# Patient Record
Sex: Male | Born: 1940 | Race: White | Hispanic: No | Marital: Single | Smoking: Never smoker
Health system: Southern US, Community
[De-identification: ages and names within clinical notes are randomized; demographics above are authoritative.]

## PROBLEM LIST (undated history)

## (undated) DIAGNOSIS — F79 Unspecified intellectual disabilities: Secondary | ICD-10-CM

## (undated) DIAGNOSIS — R569 Unspecified convulsions: Secondary | ICD-10-CM

## (undated) DIAGNOSIS — F39 Unspecified mood [affective] disorder: Secondary | ICD-10-CM

## (undated) DIAGNOSIS — K219 Gastro-esophageal reflux disease without esophagitis: Secondary | ICD-10-CM

## (undated) DIAGNOSIS — F419 Anxiety disorder, unspecified: Secondary | ICD-10-CM

## (undated) DIAGNOSIS — F0390 Unspecified dementia without behavioral disturbance: Secondary | ICD-10-CM

## (undated) HISTORY — PX: OTHER SURGICAL HISTORY: SHX169

---

## 1998-01-23 ENCOUNTER — Other Ambulatory Visit: Admission: RE | Admit: 1998-01-23 | Discharge: 1998-01-23 | Payer: Self-pay | Admitting: Family Medicine

## 1998-05-09 ENCOUNTER — Other Ambulatory Visit: Admission: RE | Admit: 1998-05-09 | Discharge: 1998-05-09 | Payer: Self-pay | Admitting: Family Medicine

## 1998-10-25 ENCOUNTER — Encounter: Payer: Self-pay | Admitting: Emergency Medicine

## 1998-10-25 ENCOUNTER — Emergency Department (HOSPITAL_COMMUNITY): Admission: EM | Admit: 1998-10-25 | Discharge: 1998-10-25 | Payer: Self-pay | Admitting: Emergency Medicine

## 2009-08-31 ENCOUNTER — Emergency Department (HOSPITAL_COMMUNITY): Admission: EM | Admit: 2009-08-31 | Discharge: 2009-08-31 | Payer: Self-pay | Admitting: Emergency Medicine

## 2010-09-30 ENCOUNTER — Emergency Department (HOSPITAL_COMMUNITY)
Admission: EM | Admit: 2010-09-30 | Discharge: 2010-09-30 | Payer: Self-pay | Source: Home / Self Care | Admitting: Emergency Medicine

## 2010-12-29 LAB — URINE CULTURE
Colony Count: NO GROWTH
Culture  Setup Time: 201112141920
Culture: NO GROWTH

## 2010-12-29 LAB — POCT URINALYSIS DIPSTICK
Bilirubin Urine: NEGATIVE
Glucose, UA: NEGATIVE mg/dL
Ketones, ur: NEGATIVE mg/dL
Nitrite: NEGATIVE
Protein, ur: NEGATIVE mg/dL
Specific Gravity, Urine: 1.01 (ref 1.005–1.030)
Urobilinogen, UA: 0.2 mg/dL (ref 0.0–1.0)
pH: 6 (ref 5.0–8.0)

## 2011-01-20 LAB — DIFFERENTIAL
Basophils Relative: 1 % (ref 0–1)
Lymphs Abs: 1.4 10*3/uL (ref 0.7–4.0)
Monocytes Relative: 5 % (ref 3–12)
Neutro Abs: 3.1 10*3/uL (ref 1.7–7.7)
Neutrophils Relative %: 60 % (ref 43–77)

## 2011-01-20 LAB — URINALYSIS, ROUTINE W REFLEX MICROSCOPIC
Hgb urine dipstick: NEGATIVE
Nitrite: NEGATIVE
Specific Gravity, Urine: 1.01 (ref 1.005–1.030)
Urobilinogen, UA: 1 mg/dL (ref 0.0–1.0)

## 2011-01-20 LAB — CBC
HCT: 37.1 % — ABNORMAL LOW (ref 39.0–52.0)
Hemoglobin: 12.4 g/dL — ABNORMAL LOW (ref 13.0–17.0)
MCHC: 33.3 g/dL (ref 30.0–36.0)
MCV: 100.6 fL — ABNORMAL HIGH (ref 78.0–100.0)
RDW: 12.6 % (ref 11.5–15.5)

## 2011-01-20 LAB — COMPREHENSIVE METABOLIC PANEL
BUN: 15 mg/dL (ref 6–23)
Calcium: 8.9 mg/dL (ref 8.4–10.5)
GFR calc non Af Amer: >60 mL/min (ref >60)
Glucose, Bld: 106 mg/dL — ABNORMAL HIGH (ref 70–99)
Total Protein: 6.2 g/dL (ref 6.0–8.3)

## 2011-01-20 LAB — URINE CULTURE

## 2011-01-20 LAB — LITHIUM LEVEL: Lithium Lvl: 0.87 mEq/L (ref 0.80–1.40)

## 2011-05-11 ENCOUNTER — Ambulatory Visit: Payer: PRIVATE HEALTH INSURANCE | Attending: Family Medicine | Admitting: Audiology

## 2011-05-11 DIAGNOSIS — H6120 Impacted cerumen, unspecified ear: Secondary | ICD-10-CM | POA: Insufficient documentation

## 2011-05-11 DIAGNOSIS — H918X9 Other specified hearing loss, unspecified ear: Secondary | ICD-10-CM | POA: Insufficient documentation

## 2012-03-20 DIAGNOSIS — R569 Unspecified convulsions: Secondary | ICD-10-CM | POA: Insufficient documentation

## 2012-04-26 ENCOUNTER — Emergency Department (HOSPITAL_COMMUNITY): Payer: PRIVATE HEALTH INSURANCE

## 2012-04-26 ENCOUNTER — Emergency Department (HOSPITAL_COMMUNITY)
Admission: EM | Admit: 2012-04-26 | Discharge: 2012-04-26 | Disposition: A | Discharge status: Home / Self Care | Payer: PRIVATE HEALTH INSURANCE | Attending: Emergency Medicine | Admitting: Emergency Medicine

## 2012-04-26 ENCOUNTER — Encounter (HOSPITAL_COMMUNITY): Payer: Self-pay

## 2012-04-26 DIAGNOSIS — R4182 Altered mental status, unspecified: Secondary | ICD-10-CM

## 2012-04-26 DIAGNOSIS — J9819 Other pulmonary collapse: Secondary | ICD-10-CM | POA: Insufficient documentation

## 2012-04-26 DIAGNOSIS — J3489 Other specified disorders of nose and nasal sinuses: Secondary | ICD-10-CM | POA: Insufficient documentation

## 2012-04-26 LAB — CBC WITH DIFFERENTIAL/PLATELET
Basophils Absolute: 0 10*3/uL (ref 0.0–0.1)
Basophils Relative: 1 % (ref 0–1)
Eosinophils Absolute: 0.5 10*3/uL (ref 0.0–0.7)
Eosinophils Relative: 6 % — ABNORMAL HIGH (ref 0–5)
HCT: 38.4 % — ABNORMAL LOW (ref 39.0–52.0)
Hemoglobin: 12.8 g/dL — ABNORMAL LOW (ref 13.0–17.0)
Lymphocytes Relative: 25 % (ref 12–46)
Lymphs Abs: 2.1 10*3/uL (ref 0.7–4.0)
MCH: 31.9 pg (ref 26.0–34.0)
MCHC: 33.3 g/dL (ref 30.0–36.0)
MCV: 95.8 fL (ref 78.0–100.0)
Monocytes Absolute: 0.7 10*3/uL (ref 0.1–1.0)
Monocytes Relative: 8 % (ref 3–12)
Neutro Abs: 5.2 10*3/uL (ref 1.7–7.7)
Neutrophils Relative %: 61 % (ref 43–77)
Platelets: 138 10*3/uL — ABNORMAL LOW (ref 150–400)
RBC: 4.01 MIL/uL — ABNORMAL LOW (ref 4.22–5.81)
RDW: 14.2 % (ref 11.5–15.5)
WBC: 8.5 10*3/uL (ref 4.0–10.5)

## 2012-04-26 LAB — URINALYSIS, ROUTINE W REFLEX MICROSCOPIC
Bilirubin Urine: NEGATIVE
Glucose, UA: NEGATIVE mg/dL
Ketones, ur: NEGATIVE mg/dL
Protein, ur: NEGATIVE mg/dL

## 2012-04-26 LAB — COMPREHENSIVE METABOLIC PANEL
ALT: 14 U/L (ref 0–53)
AST: 19 U/L (ref 0–37)
Albumin: 3.9 g/dL (ref 3.5–5.2)
Alkaline Phosphatase: 101 U/L (ref 39–117)
BUN: 24 mg/dL — ABNORMAL HIGH (ref 6–23)
CO2: 27 mEq/L (ref 19–32)
Calcium: 9.4 mg/dL (ref 8.4–10.5)
Chloride: 104 mEq/L (ref 96–112)
Creatinine, Ser: 1.27 mg/dL (ref 0.50–1.35)
GFR calc Af Amer: 64 mL/min — ABNORMAL LOW (ref >90)
GFR calc non Af Amer: 55 mL/min — ABNORMAL LOW (ref >90)
Glucose, Bld: 110 mg/dL — ABNORMAL HIGH (ref 70–99)
Potassium: 4.4 mEq/L (ref 3.5–5.1)
Sodium: 140 mEq/L (ref 135–145)
Total Bilirubin: 0.2 mg/dL — ABNORMAL LOW (ref 0.3–1.2)
Total Protein: 7.2 g/dL (ref 6.0–8.3)

## 2012-04-26 LAB — LITHIUM LEVEL: Lithium Lvl: 0.98 mEq/L (ref 0.80–1.40)

## 2012-04-26 NOTE — ED Notes (Signed)
[facility] pt is not as alert and gait disturbance noted, pt is alert and talking but still not at baseline.

## 2012-04-26 NOTE — ED Notes (Signed)
Patient transported to CT 

## 2012-04-27 NOTE — ED Provider Notes (Signed)
History     CSN: [id number]  Arrival date & time 04/26/12  1328   First MD Initiated Contact with Patient 04/26/12 1954      Chief Complaint  Patient presents with  . Altered Mental Status    (Consider location/radiation/quality/duration/timing/severity/associated sxs/prior treatment) HPI Comments: Patient is the resident of an ecf.  Was brought here for eval of not being quite himself.  He is less verbal and is more unsteady of his feet.  There is no pain and the patient is otherwise without complaint.  He has a history of MR and is accompanied by caregivers.  They are the ones giving the majority of the history as they say he does not express himself very well.  A Level 5 Caveat therefore applies.    Patient is a 71 y.o. male presenting with altered mental status. The history is provided by the patient.  Altered Mental Status This is a new problem. Episode onset: this afternoon. The problem occurs constantly. The problem has been gradually improving. Pertinent negatives include no chest pain, no headaches and no shortness of breath. Nothing aggravates the symptoms. Nothing relieves the symptoms. He has tried nothing for the symptoms.    History reviewed. No pertinent past medical history.  History reviewed. No pertinent past surgical history.  History reviewed. No pertinent family history.  History  Substance Use Topics  . Smoking status: Not on file  . Smokeless tobacco: Not on file  . Alcohol Use: Not on file      Review of Systems  Respiratory: Negative for shortness of breath.   Cardiovascular: Negative for chest pain.  Neurological: Negative for headaches.  Psychiatric/Behavioral: Positive for altered mental status.  All other systems reviewed and are negative.    Allergies  Review of patient's allergies indicates no known allergies.  Home Medications   Current Outpatient Rx  Name Route Sig Dispense Refill  . ACETAMINOPHEN 325 MG PO TABS Oral Take 650 mg  by mouth every 6 (six) hours as needed. For pain    . BENZTROPINE MESYLATE 0.5 MG PO TABS Oral Take 0.5 mg by mouth 2 (two) times daily.    Marland Kitchen FAMOTIDINE 20 MG PO TABS Oral Take 20 mg by mouth daily.    Marland Kitchen KETOCONAZOLE 2 % EX CREA Topical Apply 1 application topically 2 (two) times daily as needed. For ring worm rash    . LEVETIRACETAM 250 MG PO TABS Oral Take 250 mg by mouth every 12 (twelve) hours.    Marland Kitchen LITHIUM CARBONATE 300 MG PO CAPS Oral Take 300 mg by mouth 2 (two) times daily with a meal.    . LORAZEPAM 0.5 MG PO TABS Oral Take 0.25 mg by mouth every morning.    Marland Kitchen MEMANTINE HCL 10 MG PO TABS Oral Take 10 mg by mouth 2 (two) times daily.    . OXYBUTYNIN CHLORIDE ER 10 MG PO TB24 Oral Take 10 mg by mouth daily.    Marland Kitchen RISPERIDONE 0.5 MG PO TABS Oral Take 0.5 mg by mouth daily.    Marland Kitchen RISPERIDONE 2 MG PO TABS Oral Take 2 mg by mouth at bedtime.      BP 125/77  Pulse 62  Temp 97.4 F (36.3 C) (Oral)  Resp 16  SpO2 95%  Physical Exam  Nursing note and vitals reviewed. Constitutional: He is oriented to person, place, and time. He appears well-developed and well-nourished. No distress.  HENT:  Head: Normocephalic and atraumatic.  Mouth/Throat: Oropharynx is clear and moist.  Eyes: EOM are normal. Pupils are equal, round, and reactive to light.  Neck: Normal range of motion. Neck supple.  Cardiovascular: Normal rate and regular rhythm.   No murmur heard. Pulmonary/Chest: Effort normal and breath sounds normal. No respiratory distress. He has no wheezes.  Abdominal: Soft. Bowel sounds are normal. He exhibits no distension. There is no tenderness.  Musculoskeletal: Normal range of motion.  Lymphadenopathy:    He has no cervical adenopathy.  Neurological: He is alert and oriented to person, place, and time. No cranial nerve deficit. He exhibits normal muscle tone. Coordination normal.  Skin: Skin is warm and dry. He is not diaphoretic.    ED Course  Procedures (including critical care  time)  Labs Reviewed  CBC WITH DIFFERENTIAL - Abnormal; Notable for the following:    RBC 4.01 (*)     Hemoglobin 12.8 (*)     HCT 38.4 (*)     Platelets 138 (*)     Eosinophils Relative 6 (*)     All other components within normal limits  COMPREHENSIVE METABOLIC PANEL - Abnormal; Notable for the following:    Glucose, Bld 110 (*)     BUN 24 (*)     Total Bilirubin 0.2 (*)     GFR calc non Af Amer 55 (*)     GFR calc Af Amer 64 (*)     All other components within normal limits  URINALYSIS, ROUTINE W REFLEX MICROSCOPIC - Abnormal; Notable for the following:    Hgb urine dipstick TRACE (*)     All other components within normal limits  LITHIUM LEVEL  URINE MICROSCOPIC-ADD ON  LAB REPORT - SCANNED   Dg Chest 2 View  04/26/2012  *RADIOLOGY REPORT*  Clinical Data: Altered mental status  CHEST - 2 VIEW  Comparison: None.  Findings: Hypoventilation with decreased lung volume and mild atelectasis in the lung bases.  Negative for heart failure or pneumonia.  No pleural effusion.  The aorta is uncoiled,  IMPRESSION: Mild bibasilar atelectasis.  Original Report Authenticated By: Camelia Phenes, M.D.   Ct Head Wo Contrast  04/26/2012  *RADIOLOGY REPORT*  Clinical Data: Altered mental status.  CT HEAD WITHOUT CONTRAST  Technique:  Contiguous axial images were obtained from the base of the skull through the vertex without contrast.  Comparison: None.  Findings: There is no acute intracranial hemorrhage, infarction, or mass lesion.  Brain parenchyma is normal.  No acute osseous abnormality.  Chronic extensive opacification of the ethmoid and frontal sinuses.  Chronic mucosal thickening of the maxillary sinuses.  IMPRESSION: No acute intracranial abnormality.  Chronic sinus disease.  Original Report Authenticated By: Gwynn Burly, M.D.     1. Mental status change       MDM  The patient presents for eval of not acting quite himself.  I initiated a workup that included laboratory work, ua,  and ct of the head.  These were all unremarkable.  His mental status improved and seemed to return to his baseline.  At this point, he will be discharged.  To return prn if he worsens.        Geoffery Lyons, MD 04/27/12 1009

## 2012-10-04 ENCOUNTER — Other Ambulatory Visit: Payer: Self-pay | Admitting: Nephrology

## 2012-10-04 ENCOUNTER — Ambulatory Visit
Admission: RE | Admit: 2012-10-04 | Discharge: 2012-10-04 | Disposition: A | Discharge status: Home / Self Care | Payer: PRIVATE HEALTH INSURANCE | Source: Ambulatory Visit | Attending: Nephrology | Admitting: Nephrology

## 2012-10-04 DIAGNOSIS — M25559 Pain in unspecified hip: Secondary | ICD-10-CM

## 2013-01-24 ENCOUNTER — Encounter (HOSPITAL_COMMUNITY): Payer: Self-pay | Admitting: Emergency Medicine

## 2013-01-24 ENCOUNTER — Emergency Department (HOSPITAL_COMMUNITY): Payer: PRIVATE HEALTH INSURANCE

## 2013-01-24 ENCOUNTER — Emergency Department (HOSPITAL_COMMUNITY)
Admission: EM | Admit: 2013-01-24 | Discharge: 2013-01-24 | Disposition: A | Discharge status: Home / Self Care | Payer: PRIVATE HEALTH INSURANCE | Attending: Emergency Medicine | Admitting: Emergency Medicine

## 2013-01-24 DIAGNOSIS — R29898 Other symptoms and signs involving the musculoskeletal system: Secondary | ICD-10-CM

## 2013-01-24 DIAGNOSIS — Z8669 Personal history of other diseases of the nervous system and sense organs: Secondary | ICD-10-CM | POA: Insufficient documentation

## 2013-01-24 DIAGNOSIS — Z8659 Personal history of other mental and behavioral disorders: Secondary | ICD-10-CM | POA: Insufficient documentation

## 2013-01-24 DIAGNOSIS — Y9389 Activity, other specified: Secondary | ICD-10-CM | POA: Insufficient documentation

## 2013-01-24 DIAGNOSIS — M6281 Muscle weakness (generalized): Secondary | ICD-10-CM | POA: Insufficient documentation

## 2013-01-24 DIAGNOSIS — R51 Headache: Secondary | ICD-10-CM | POA: Insufficient documentation

## 2013-01-24 DIAGNOSIS — Y9210 Unspecified residential institution as the place of occurrence of the external cause: Secondary | ICD-10-CM | POA: Insufficient documentation

## 2013-01-24 DIAGNOSIS — W1811XA Fall from or off toilet without subsequent striking against object, initial encounter: Secondary | ICD-10-CM | POA: Insufficient documentation

## 2013-01-24 DIAGNOSIS — W19XXXA Unspecified fall, initial encounter: Secondary | ICD-10-CM

## 2013-01-24 HISTORY — DX: Unspecified mood (affective) disorder: F39

## 2013-01-24 HISTORY — DX: Unspecified convulsions: R56.9

## 2013-01-24 HISTORY — DX: Unspecified intellectual disabilities: F79

## 2013-01-24 LAB — POCT I-STAT, CHEM 8
BUN: 21 mg/dL (ref 6–23)
Calcium, Ion: 1.24 mmol/L (ref 1.13–1.30)
Chloride: 112 mEq/L (ref 96–112)
HCT: 41 % (ref 39.0–52.0)
Potassium: 3.9 mEq/L (ref 3.5–5.1)

## 2013-01-24 LAB — CBC
HCT: 40.2 % (ref 39.0–52.0)
MCV: 96.6 fL (ref 78.0–100.0)
Platelets: 147 10*3/uL — ABNORMAL LOW (ref 150–400)
RBC: 4.16 MIL/uL — ABNORMAL LOW (ref 4.22–5.81)
WBC: 11 10*3/uL — ABNORMAL HIGH (ref 4.0–10.5)

## 2013-01-24 LAB — POCT I-STAT TROPONIN I: Troponin i, poc: 0.08 ng/mL (ref 0.00–0.08)

## 2013-01-24 NOTE — Progress Notes (Signed)
CSW met with pt and pt house manager of group home, [facility], at bedside. Pt states he is feeling well but hungry. Per [name], pt is usually independent with minimal assistance. Per discussion with [name] patient can return when medically stable. CSW continuing to follow to assist with pt further disposition needs.   Catha Gosselin, Theresia Majors  [phone] 01/24/2013 12:44pm

## 2013-01-24 NOTE — ED Notes (Signed)
Pt presenting to ed with c/o sent from group home [facility] for fall this morning pt and staff deny loc pt is alert at this time. Pt able to answer certain questions appropriately. Per staff states that pt was a witnessed fall.

## 2013-01-24 NOTE — ED Provider Notes (Signed)
History     CSN: [id number]  Arrival date & time 01/24/13  1028   First MD Initiated Contact with Patient 01/24/13 1043      Chief Complaint  Patient presents with  . Fall    (Consider location/radiation/quality/duration/timing/severity/associated sxs/prior treatment) HPI Shawn Wilcox is a 72 y.o. male who presents to ED from a group home, with complaint of a fall. Pt was in the bathroom by himself, when he states he slid and fell off the commode. When staff went in there immediately after him falling, they stated he was on the floor against the door, and had stool all over the commode and floor. Pt was alert, at his baseline mental status wise. They tried to get him up but were unable to. Pt seemed to have weakness in both legs. Pt normally walks by himself, since the fall, difficulty standing and walking. Here for evaluation. Pt denies any pain. No signs of head trauma. Pt has hx of MR, poor historian.    Past Medical History  Diagnosis Date  . Mental retardation   . Seizures   . Mood disorder     History reviewed. No pertinent past surgical history.  No family history on file.  History  Substance Use Topics  . Smoking status: Never Smoker   . Smokeless tobacco: Not on file  . Alcohol Use: No      Review of Systems  Constitutional: Negative for fever and chills.  Skin: Negative for wound.  Neurological: Positive for weakness. Negative for headaches.  All other systems reviewed and are negative.    Allergies  Review of patient's allergies indicates no known allergies.  Home Medications  No current outpatient prescriptions on file.  There were no vitals taken for this visit.  Physical Exam  Nursing note and vitals reviewed. Constitutional: He appears well-developed and well-nourished. No distress.  HENT:  Head: Normocephalic and atraumatic.  Nose: Nose normal.  Mouth/Throat: Oropharynx is clear and moist.  Eyes: Conjunctivae and EOM are normal. Pupils are  equal, round, and reactive to light.  Neck: Neck supple.  Cardiovascular: Normal rate, regular rhythm and normal heart sounds.   Pulmonary/Chest: Effort normal and breath sounds normal. No respiratory distress. He has no wheezes. He has no rales.  Abdominal: Soft. Bowel sounds are normal. He exhibits no distension. There is no tenderness. There is no rebound.  Musculoskeletal: Normal range of motion.  No tenderness over entire spine. No tenderness over pelvis. Full rom of bilateral hips, knees, ankles.   Neurological: He is alert. No cranial nerve deficit. Coordination normal.  5/5 and equal upper and lower extremity strength. Pt follows all commands appropriatly  Skin: Skin is warm and dry.    ED Course  Procedures (including critical care time)   Date: 01/24/2013  Rate: 77  Rhythm: normal sinus rhythm  QRS Axis: normal  Intervals: normal  ST/T Wave abnormalities: normal  Conduction Disutrbances:first-degree A-V block   Narrative Interpretation:   Old EKG Reviewed: unchanged  Results for orders placed during the hospital encounter of 01/24/13  CBC      Result Value Range   WBC 11.0 (*) 4.0 - 10.5 K/uL   RBC 4.16 (*) 4.22 - 5.81 MIL/uL   Hemoglobin 13.3  13.0 - 17.0 g/dL   HCT 40.9  81.1 - 91.4 %   MCV 96.6  78.0 - 100.0 fL   MCH 32.0  26.0 - 34.0 pg   MCHC 33.1  30.0 - 36.0 g/dL   RDW 13.3  11.5 - 15.5 %   Platelets 147 (*) 150 - 400 K/uL  POCT I-STAT, CHEM 8      Result Value Range   Sodium 147 (*) 135 - 145 mEq/L   Potassium 3.9  3.5 - 5.1 mEq/L   Chloride 112  96 - 112 mEq/L   BUN 21  6 - 23 mg/dL   Creatinine, Ser 1.61  0.50 - 1.35 mg/dL   Glucose, Bld 90  70 - 99 mg/dL   Calcium, Ion 0.96  0.45 - 1.30 mmol/L   TCO2 24  0 - 100 mmol/L   Hemoglobin 13.9  13.0 - 17.0 g/dL   HCT 40.9  81.1 - 91.4 %  POCT I-STAT TROPONIN I      Result Value Range   Troponin i, poc 0.08  0.00 - 0.08 ng/mL   Comment NOTIFIED PHYSICIAN     Comment 3            Ct Head Wo  Contrast  01/24/2013  *RADIOLOGY REPORT*  Clinical Data:  History of trauma from a fall.  Head and neck pain.  CT HEAD WITHOUT CONTRAST CT CERVICAL SPINE WITHOUT CONTRAST  Technique:  Multidetector CT imaging of the head and cervical spine was performed following the standard protocol without intravenous contrast.  Multiplanar CT image reconstructions of the cervical spine were also generated.  Comparison:  Head CT 08/31/2009.  CT HEAD  Findings: No acute displaced skull fractures are identified.  No acute intracranial abnormality.  Specifically, no evidence of acute post-traumatic intracranial hemorrhage, no definite regions of acute/subacute cerebral ischemia, no focal mass, mass effect, hydrocephalus or abnormal intra or extra-axial fluid collections. Extensive mucoperiosteal thickening throughout all aspects of the visualized paranasal sinuses, many which are completely occluded, compatible with chronic sinusitis.  Mastoids are well pneumatized.  IMPRESSION: 1.  No acute displaced skull fractures or acute intracranial abnormalities. 2.  The appearance of the brain is normal. 3.  Severe changes of chronic sinusitis, significantly worsened compared to prior examination from 08/31/2009, as above.  CT CERVICAL SPINE  Findings: No acute displaced cervical spine fractures are noted. Mild exaggeration of normal cervical lordosis.  Prevertebral soft tissues are normal.  Multilevel degenerative disc disease, most severe at C6-C7 where there is complete bony fusion.  Mild multilevel facet arthropathy.  Small ossifications posterior to the cervical spine at the level of C5-C6 likely related to prior trauma and ligamentous injury in this region.  Visualized portions of the upper thorax are unremarkable.  IMPRESSION: 1.  No evidence of significant acute traumatic injury to the cervical spine. 2.  Multilevel degenerative disc disease and cervical spondylosis, as above.   Original Report Authenticated By: Trudie Reed, M.D.     Ct Cervical Spine Wo Contrast  01/24/2013  *RADIOLOGY REPORT*  Clinical Data:  History of trauma from a fall.  Head and neck pain.  CT HEAD WITHOUT CONTRAST CT CERVICAL SPINE WITHOUT CONTRAST  Technique:  Multidetector CT imaging of the head and cervical spine was performed following the standard protocol without intravenous contrast.  Multiplanar CT image reconstructions of the cervical spine were also generated.  Comparison:  Head CT 08/31/2009.  CT HEAD  Findings: No acute displaced skull fractures are identified.  No acute intracranial abnormality.  Specifically, no evidence of acute post-traumatic intracranial hemorrhage, no definite regions of acute/subacute cerebral ischemia, no focal mass, mass effect, hydrocephalus or abnormal intra or extra-axial fluid collections. Extensive mucoperiosteal thickening throughout all aspects of the visualized paranasal sinuses, many which  are completely occluded, compatible with chronic sinusitis.  Mastoids are well pneumatized.  IMPRESSION: 1.  No acute displaced skull fractures or acute intracranial abnormalities. 2.  The appearance of the brain is normal. 3.  Severe changes of chronic sinusitis, significantly worsened compared to prior examination from 08/31/2009, as above.  CT CERVICAL SPINE  Findings: No acute displaced cervical spine fractures are noted. Mild exaggeration of normal cervical lordosis.  Prevertebral soft tissues are normal.  Multilevel degenerative disc disease, most severe at C6-C7 where there is complete bony fusion.  Mild multilevel facet arthropathy.  Small ossifications posterior to the cervical spine at the level of C5-C6 likely related to prior trauma and ligamentous injury in this region.  Visualized portions of the upper thorax are unremarkable.  IMPRESSION: 1.  No evidence of significant acute traumatic injury to the cervical spine. 2.  Multilevel degenerative disc disease and cervical spondylosis, as above.   Original Report Authenticated  By: Trudie Reed, M.D.       1. Fall, initial encounter   2. Leg weakness       MDM  Pt post fall in the bathroom today. No signs of seizure or syncope. Pt was alert when staff came in. Pt had difficulty getting up and walking, however is ambulatory here in ED. Walked up and down hallway.  Labs and CTs here unremarkable. Do not think any injury to legs or pelvis, full rom of all joints and able to bear weight with no pain. Able to ambulate with no pain. He is at baseline mental status wise. He has no neuro deficits. Doubt CVA. Will d/c home with close follow up. Assist with walking at home. Return if symptoms worsening.   Filed Vitals:   01/24/13 1321  BP: 157/83  Pulse: 81  Resp: 16  SpO2: 99%           Lottie Mussel, PA-C 01/24/13 1921

## 2013-01-24 NOTE — ED Notes (Signed)
[id number]<[id number]> Expected date:<BR> Expected time:<BR> Means of arrival:<BR> Comments:<BR> 72 y/o M fall

## 2013-01-24 NOTE — Progress Notes (Signed)
Pt confirms pcp is elizabeth barnes [facility] updated

## 2013-01-24 NOTE — ED Notes (Signed)
Gave  I Stat Troponin results to Tatyana critical 0.08

## 2013-01-25 NOTE — ED Provider Notes (Signed)
  Medical screening examination/treatment/procedure(s) were performed by non-physician practitioner and as supervising physician I was immediately available for consultation/collaboration.    Gerhard Munch, MD 01/25/13 1549

## 2013-01-29 ENCOUNTER — Encounter (HOSPITAL_COMMUNITY): Payer: Self-pay | Admitting: Emergency Medicine

## 2013-03-14 ENCOUNTER — Emergency Department (HOSPITAL_COMMUNITY): Payer: PRIVATE HEALTH INSURANCE

## 2013-03-14 ENCOUNTER — Emergency Department (HOSPITAL_COMMUNITY)
Admission: EM | Admit: 2013-03-14 | Discharge: 2013-03-14 | Disposition: A | Discharge status: Home / Self Care | Payer: PRIVATE HEALTH INSURANCE | Attending: Emergency Medicine | Admitting: Emergency Medicine

## 2013-03-14 ENCOUNTER — Encounter (HOSPITAL_COMMUNITY): Payer: Self-pay | Admitting: *Deleted

## 2013-03-14 DIAGNOSIS — F39 Unspecified mood [affective] disorder: Secondary | ICD-10-CM | POA: Insufficient documentation

## 2013-03-14 DIAGNOSIS — G40909 Epilepsy, unspecified, not intractable, without status epilepticus: Secondary | ICD-10-CM | POA: Insufficient documentation

## 2013-03-14 DIAGNOSIS — R279 Unspecified lack of coordination: Secondary | ICD-10-CM | POA: Insufficient documentation

## 2013-03-14 DIAGNOSIS — F79 Unspecified intellectual disabilities: Secondary | ICD-10-CM | POA: Insufficient documentation

## 2013-03-14 DIAGNOSIS — Z79899 Other long term (current) drug therapy: Secondary | ICD-10-CM | POA: Insufficient documentation

## 2013-03-14 DIAGNOSIS — R410 Disorientation, unspecified: Secondary | ICD-10-CM

## 2013-03-14 DIAGNOSIS — F29 Unspecified psychosis not due to a substance or known physiological condition: Secondary | ICD-10-CM | POA: Insufficient documentation

## 2013-03-14 LAB — COMPREHENSIVE METABOLIC PANEL
Alkaline Phosphatase: 105 U/L (ref 39–117)
BUN: 21 mg/dL (ref 6–23)
Chloride: 107 mEq/L (ref 96–112)
GFR calc Af Amer: 66 mL/min — ABNORMAL LOW (ref >90)
GFR calc non Af Amer: 57 mL/min — ABNORMAL LOW (ref >90)
Glucose, Bld: 90 mg/dL (ref 70–99)
Potassium: 3.9 mEq/L (ref 3.5–5.1)
Total Bilirubin: 0.5 mg/dL (ref 0.3–1.2)
Total Protein: 6.5 g/dL (ref 6.0–8.3)

## 2013-03-14 LAB — CBC WITH DIFFERENTIAL/PLATELET
Eosinophils Absolute: 0.3 10*3/uL (ref 0.0–0.7)
Hemoglobin: 12 g/dL — ABNORMAL LOW (ref 13.0–17.0)
Lymphs Abs: 1.4 10*3/uL (ref 0.7–4.0)
MCH: 32.5 pg (ref 26.0–34.0)
Monocytes Relative: 8 % (ref 3–12)
Neutrophils Relative %: 72 % (ref 43–77)
RBC: 3.69 MIL/uL — ABNORMAL LOW (ref 4.22–5.81)

## 2013-03-14 LAB — URINALYSIS, ROUTINE W REFLEX MICROSCOPIC
Bilirubin Urine: NEGATIVE
Hgb urine dipstick: NEGATIVE
Protein, ur: NEGATIVE mg/dL
Urobilinogen, UA: 0.2 mg/dL (ref 0.0–1.0)

## 2013-03-14 LAB — PROTIME-INR: Prothrombin Time: 13.2 seconds (ref 11.6–15.2)

## 2013-03-14 MED ORDER — SODIUM CHLORIDE 0.9 % IV SOLN
1000.0000 mL | INTRAVENOUS | Status: DC
  Administered 2013-03-14: 1000 mL via INTRAVENOUS

## 2013-03-14 NOTE — ED Notes (Signed)
Patient ambulated 60 feet with no assistance.

## 2013-03-14 NOTE — ED Notes (Addendum)
Per EMS pt coming from PCP office with c/o ALOC. Per EMS pt has hx of mental retardation and lives at group home where he has caregiver. Caregiver took pt to PCP today d/t changes in his mental status. Per EMS pt's caregiver reported pt was disoriented at times more than his baseline and had unsteady gait.

## 2013-03-14 NOTE — ED Provider Notes (Signed)
History    CSN: [id number] Arrival date & time 03/14/13  1248First MD Initiated Contact with Patient 03/14/13 1257      Chief Complaint  Patient presents with  . Altered Mental Status    HPI The patient presents to the emergency room because of altered mental status this morning.  The patient has history of mental retardation. He lives in a group home.  According to his caregiver today he seemed to be more unsteady than usual when he was walking. He be a little bit more disoriented than usual.  She has had trouble with his balance and his coordination in the past but usually is able to walk without difficulty. He has not had any recent falls. He has not been complaining of any pain. He is not having any fever. He went to his doctor's office and was sent to the emergency room for further evaluation.  His caregiver states that he seems to be more like his normal self at this point. He just wants to have something to eat and keeps on asking about that. Past Medical History  Diagnosis Date  . Mental retardation   . Seizures   . Mood disorder     History reviewed. No pertinent past surgical history.  History reviewed. No pertinent family history.  History  Substance Use Topics  . Smoking status: Never Smoker   . Smokeless tobacco: Not on file  . Alcohol Use: No      Review of Systems  All other systems reviewed and are negative.    Allergies  Review of patient's allergies indicates no known allergies.  Home Medications   Current Outpatient Rx  Name  Route  Sig  Dispense  Refill  . acetaminophen (TYLENOL) 325 MG tablet   Oral   Take 650 mg by mouth every 6 (six) hours as needed. For pain         . benztropine (COGENTIN) 0.5 MG tablet   Oral   Take 0.5 mg by mouth 2 (two) times daily.         . famotidine (PEPCID) 20 MG tablet   Oral   Take 20 mg by mouth daily.         Marland Kitchen ketoconazole (NIZORAL) 2 % cream   Topical   Apply 1 application topically 2 (two) times  daily as needed. For ring worm rash         . levETIRAcetam (KEPPRA) 250 MG tablet   Oral   Take 250 mg by mouth every 12 (twelve) hours.         Marland Kitchen lithium carbonate 300 MG capsule   Oral   Take 300 mg by mouth 2 (two) times daily with a meal.         . LORazepam (ATIVAN) 0.5 MG tablet   Oral   Take 0.5 mg by mouth every morning.          . memantine (NAMENDA) 10 MG tablet   Oral   Take 10 mg by mouth 2 (two) times daily.         Marland Kitchen oxybutynin (DITROPAN-XL) 10 MG 24 hr tablet   Oral   Take 10 mg by mouth daily.         . risperiDONE (RISPERDAL) 0.5 MG tablet   Oral   Take 0.5 mg by mouth daily.         . risperiDONE (RISPERDAL) 2 MG tablet   Oral   Take 2 mg by mouth at bedtime.  BP 114/71  Pulse 73  Temp(Src) 98.3 F (36.8 C)  Resp 16  SpO2 96%  Physical Exam  Nursing note and vitals reviewed. Constitutional: He appears well-developed and well-nourished. No distress.  HENT:  Head: Normocephalic and atraumatic.  Right Ear: External ear normal.  Left Ear: External ear normal.  Mouth/Throat: Oropharynx is clear and moist.  Eyes: Conjunctivae are normal. Right eye exhibits no discharge. Left eye exhibits no discharge. No scleral icterus.  Neck: Neck supple. No tracheal deviation present.  Cardiovascular: Normal rate, regular rhythm and intact distal pulses.   Pulmonary/Chest: Effort normal and breath sounds normal. No stridor. No respiratory distress. He has no wheezes. He has no rales.  Abdominal: Soft. Bowel sounds are normal. He exhibits no distension. There is no tenderness. There is no rebound and no guarding.  Musculoskeletal: He exhibits no edema and no tenderness.  Neurological: He is alert. He has normal strength. No cranial nerve deficit ( no gross defecits noted) or sensory deficit. He exhibits normal muscle tone. He displays no seizure activity. Coordination normal.  No pronator drift bilateral upper extrem, able to hold both legs  off bed for 5 seconds, sensation intact in all extremities, no visual field cuts, no left or right sided neglect, patient is aware of his location, he is able to tell me the weather outside  Skin: Skin is warm and dry. No rash noted.  Psychiatric: He has a normal mood and affect.    ED Course  Procedures (including critical care time) EKG Normal sinus rhythm first degree AV block Normal axis, normal intervals Normal ST T waves No significant change Labs Reviewed  CBC WITH DIFFERENTIAL - Abnormal; Notable for the following:    RBC 3.69 (*)    Hemoglobin 12.0 (*)    HCT 35.2 (*)    All other components within normal limits  COMPREHENSIVE METABOLIC PANEL - Abnormal; Notable for the following:    Albumin 3.4 (*)    GFR calc non Af Amer 57 (*)    GFR calc Af Amer 66 (*)    All other components within normal limits  APTT  PROTIME-INR  URINALYSIS, ROUTINE W REFLEX MICROSCOPIC   Dg Chest 1 View  03/14/2013   *RADIOLOGY REPORT*  Clinical Data: Altered mental status  CHEST - 1 VIEW  Comparison: 08/31/2009  Findings: Enlargement of cardiac silhouette. Tortuous aorta. Prominence of mediastinum likely related to AP technique. Mild right basilar atelectasis. No gross infiltrate, pleural effusion or pneumothorax.  IMPRESSION: Enlargement of cardiac silhouette. Mild right base atelectasis.   Original Report Authenticated By: Ulyses Southward, M.D.   Ct Head Wo Contrast  03/14/2013   *RADIOLOGY REPORT*  Clinical Data: Altered mental status, disorientation, unsteady gait, history mental retardation, seizures  CT HEAD WITHOUT CONTRAST  Technique:  Contiguous axial images were obtained from the base of the skull through the vertex without contrast.  Comparison: 01/24/2013  Findings: Minimal age-related atrophy. Normal ventricular morphology. No midline shift or mass effect. Otherwise normal appearance of brain parenchyma. No intracranial hemorrhage, mass lesion, or evidence of acute infarction. No extra-axial  fluid collections. Chronic mucosal thickening throughout the paranasal sinuses with air-fluid level left sphenoid sinus. No acute osseous findings.  IMPRESSION: No acute intracranial abnormalities. Chronic sinus disease changes.   Original Report Authenticated By: Ulyses Southward, M.D.     1. Confusion       MDM  Patient has been able to ambulate around the emergency department. He is alert and in no distress. Patient does  not appear to be weak or confused.  At this time there does not appear to be any evidence of an acute emergency medical condition and the patient appears stable for discharge with appropriate outpatient follow up.         Celene Kras, MD 03/14/13 [id number]

## 2013-03-14 NOTE — ED Notes (Signed)
ZOX:[id number]<AV> Expected date:<BR> Expected time:<BR> Means of arrival:<BR> Comments:<BR> Aloc, from PCP office

## 2013-04-17 ENCOUNTER — Ambulatory Visit (INDEPENDENT_AMBULATORY_CARE_PROVIDER_SITE_OTHER): Payer: Medicare Other | Admitting: Nurse Practitioner

## 2013-04-17 ENCOUNTER — Encounter: Payer: Self-pay | Admitting: Nurse Practitioner

## 2013-04-17 VITALS — BP 125/76 | HR 60 | Ht 69.0 in | Wt 226.0 lb

## 2013-04-17 DIAGNOSIS — R569 Unspecified convulsions: Secondary | ICD-10-CM

## 2013-04-17 DIAGNOSIS — G40909 Epilepsy, unspecified, not intractable, without status epilepticus: Secondary | ICD-10-CM

## 2013-04-17 DIAGNOSIS — G40309 Generalized idiopathic epilepsy and epileptic syndromes, not intractable, without status epilepticus: Secondary | ICD-10-CM | POA: Insufficient documentation

## 2013-04-17 NOTE — Patient Instructions (Addendum)
Continue Keppra at current dose Revisit in one year.

## 2013-04-17 NOTE — Progress Notes (Signed)
HPI: Patient returns for a yearly followup. He is a 72 year old male accompanied by staff from his group home for epilepsy. He was last seen 03/20/2012.   He was born with mental retardation, had epilepsy since age 51, forceful head deviation to left, followed by [facility],  he has multiple recurrent episodes for one year, but since he was on dilantin 100mg ,he no longer has seizure.  He has been on Dilantin 100 bid for 50 yrs, his last seizure was 50years ago.  During a  visit he was switched  to keppra  250 mg b.i.d.in 2011.   He has no recurrent episodes. His psych care is in [place] now. There have been no issues with behavior, no falls. He goes to a day program. No new neurologic complaints.   ROS:  Cognitive impairment  Physical Exam General: well developed, well nourished, seated, in no evident distress Head: head normocephalic and atraumatic. Oropharynx benign Neck: supple with no carotid  bruits Cardiovascular: regular rate and rhythm, no murmurs  Neurologic Exam Mental Status: Awake and fully alert. Oriented to place and time. Follows all commands. Speech and language normal.   Cranial Nerves:  Pupils equal, briskly reactive to light. Extraocular movements full without nystagmus. Visual fields full to confrontation. Hearing intact and symmetric to finger snap. Facial sensation intact. Face, tongue, palate move normally and symmetrically. Neck flexion and extension normal.  Motor: Normal bulk and tone. Normal strength in all tested extremity muscles.No focal weakness Coordination: Rapid alternating movements normal in all extremities. Finger-to-nose and heel-to-shin performed accurately bilaterally. No dysmetria Gait and Station: Arises from chair without difficulty. 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

## 2013-07-14 IMAGING — CR DG HIP (WITH OR WITHOUT PELVIS) 2-3V*L*
2 series · 2 of 2 positions shown · non-contrast
Comparison: None.

CLINICAL DATA: Severe left hip pain.

LEFT HIP - COMPLETE 2+ VIEW

[t hip ap left]
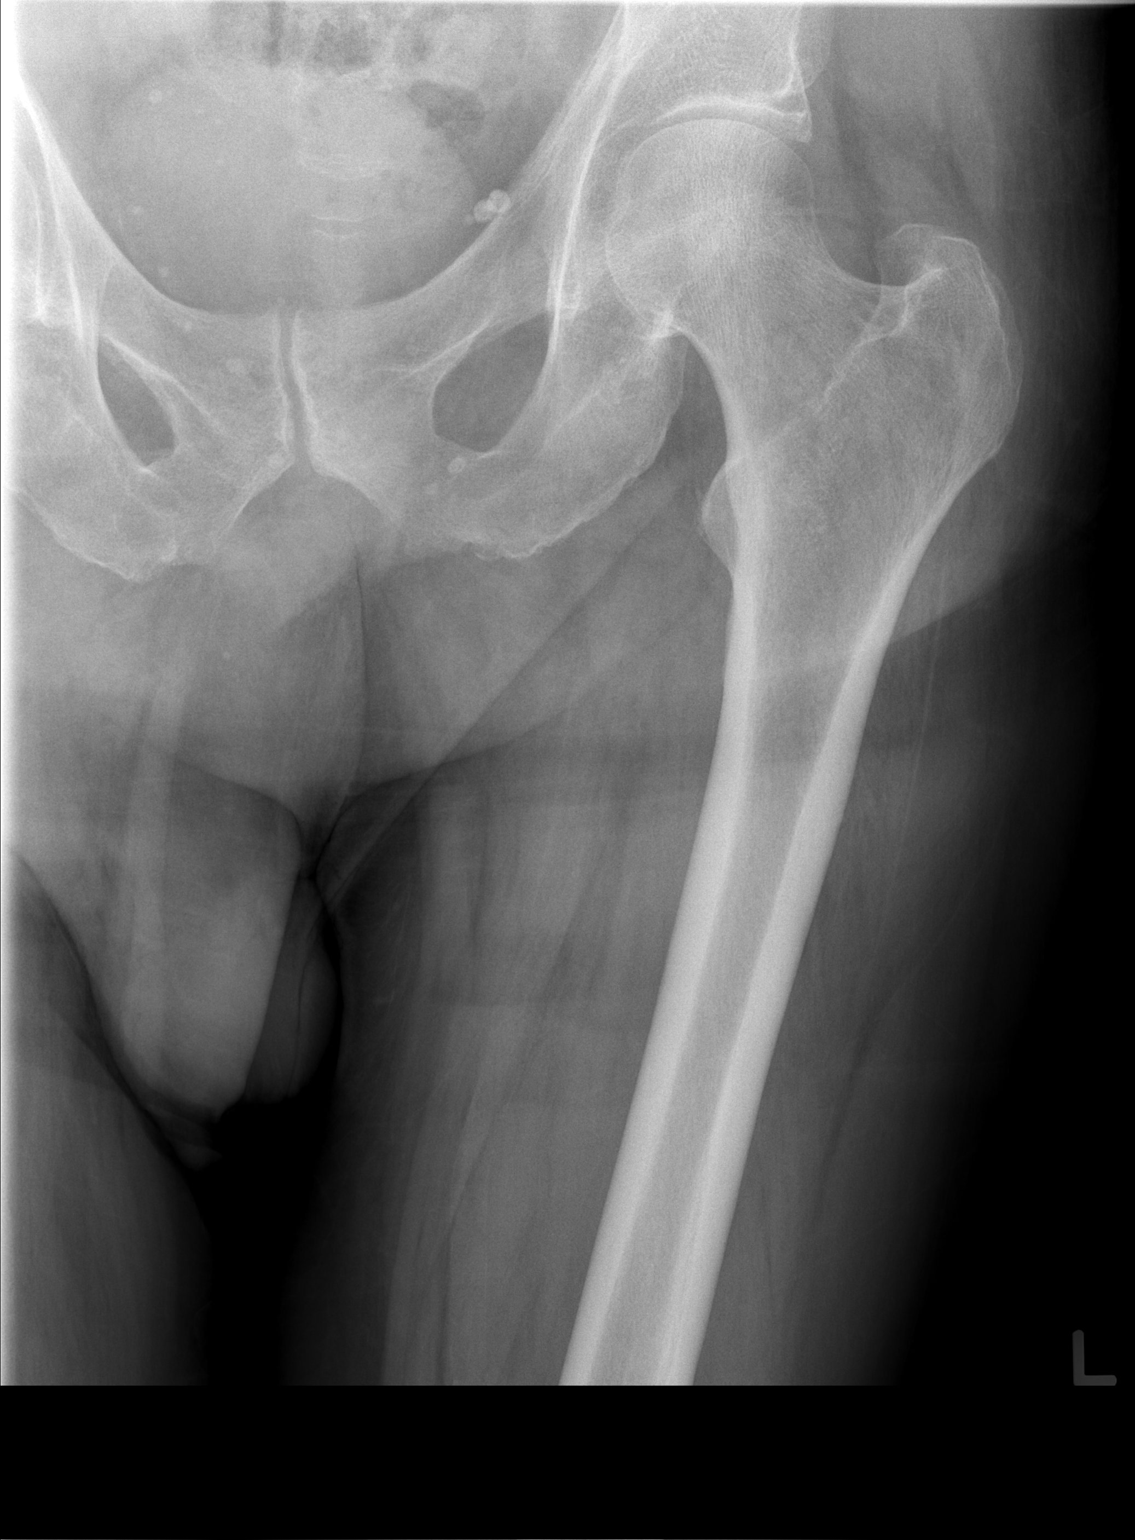

[t hip frog leg left]
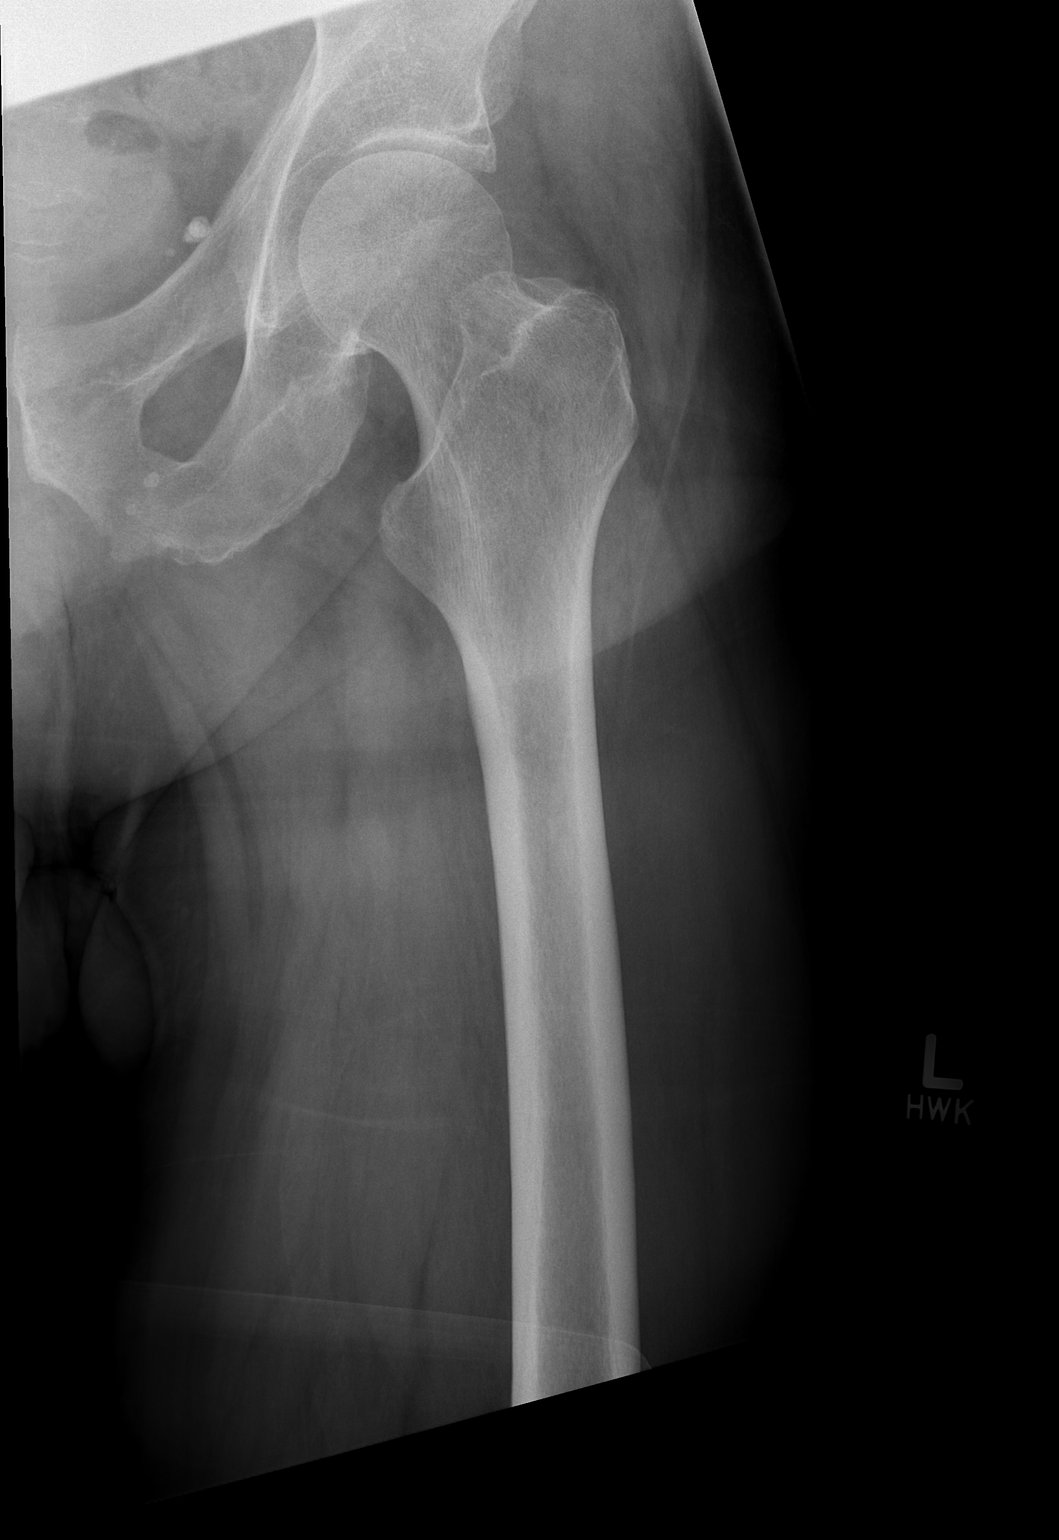

[2 of 2 positions shown; findings below may reference images not displayed]

FINDINGS: The patient was unable to bend his knee for a proper
lateral view.  Mild osteophytosis in the left hip.  Hip joint space
appears preserved.  Phleboliths in the anatomic pelvis.
IMPRESSION: Minimal degenerative change in the left hip.

## 2014-04-10 ENCOUNTER — Emergency Department (HOSPITAL_COMMUNITY): Payer: PRIVATE HEALTH INSURANCE

## 2014-04-10 ENCOUNTER — Emergency Department (HOSPITAL_COMMUNITY)
Admission: EM | Admit: 2014-04-10 | Discharge: 2014-04-10 | Disposition: A | Discharge status: Home / Self Care | Payer: PRIVATE HEALTH INSURANCE | Attending: Emergency Medicine | Admitting: Emergency Medicine

## 2014-04-10 ENCOUNTER — Encounter (HOSPITAL_COMMUNITY): Payer: Self-pay | Admitting: Emergency Medicine

## 2014-04-10 DIAGNOSIS — R5383 Other fatigue: Secondary | ICD-10-CM

## 2014-04-10 DIAGNOSIS — Y9389 Activity, other specified: Secondary | ICD-10-CM | POA: Insufficient documentation

## 2014-04-10 DIAGNOSIS — R569 Unspecified convulsions: Secondary | ICD-10-CM | POA: Insufficient documentation

## 2014-04-10 DIAGNOSIS — Z8659 Personal history of other mental and behavioral disorders: Secondary | ICD-10-CM | POA: Insufficient documentation

## 2014-04-10 DIAGNOSIS — Z043 Encounter for examination and observation following other accident: Secondary | ICD-10-CM | POA: Insufficient documentation

## 2014-04-10 DIAGNOSIS — W19XXXA Unspecified fall, initial encounter: Secondary | ICD-10-CM

## 2014-04-10 DIAGNOSIS — Z79899 Other long term (current) drug therapy: Secondary | ICD-10-CM | POA: Insufficient documentation

## 2014-04-10 DIAGNOSIS — R296 Repeated falls: Secondary | ICD-10-CM | POA: Insufficient documentation

## 2014-04-10 DIAGNOSIS — R5381 Other malaise: Secondary | ICD-10-CM | POA: Insufficient documentation

## 2014-04-10 DIAGNOSIS — Y9289 Other specified places as the place of occurrence of the external cause: Secondary | ICD-10-CM | POA: Insufficient documentation

## 2014-04-10 LAB — I-STAT CG4 LACTIC ACID, ED: Lactic Acid, Venous: 0.81 mmol/L (ref 0.5–2.2)

## 2014-04-10 LAB — URINALYSIS, ROUTINE W REFLEX MICROSCOPIC
Bilirubin Urine: NEGATIVE
Glucose, UA: NEGATIVE mg/dL
KETONES UR: NEGATIVE mg/dL
LEUKOCYTES UA: NEGATIVE
NITRITE: NEGATIVE
PH: 6 (ref 5.0–8.0)
PROTEIN: NEGATIVE mg/dL
Specific Gravity, Urine: 1.011 (ref 1.005–1.030)
Urobilinogen, UA: 0.2 mg/dL (ref 0.0–1.0)

## 2014-04-10 LAB — BASIC METABOLIC PANEL
BUN: 28 mg/dL — ABNORMAL HIGH (ref 6–23)
CHLORIDE: 107 meq/L (ref 96–112)
CO2: 21 mEq/L (ref 19–32)
Calcium: 9 mg/dL (ref 8.4–10.5)
Creatinine, Ser: 1.61 mg/dL — ABNORMAL HIGH (ref 0.50–1.35)
GFR calc Af Amer: 47 mL/min — ABNORMAL LOW (ref >90)
GFR, EST NON AFRICAN AMERICAN: 41 mL/min — AB (ref >90)
GLUCOSE: 155 mg/dL — AB (ref 70–99)
POTASSIUM: 4.1 meq/L (ref 3.7–5.3)
SODIUM: 141 meq/L (ref 137–147)

## 2014-04-10 LAB — I-STAT TROPONIN, ED: Troponin i, poc: 0.01 ng/mL (ref 0.00–0.08)

## 2014-04-10 LAB — CBC
HEMATOCRIT: 35.2 % — AB (ref 39.0–52.0)
HEMOGLOBIN: 11.3 g/dL — AB (ref 13.0–17.0)
MCH: 31.7 pg (ref 26.0–34.0)
MCHC: 32.1 g/dL (ref 30.0–36.0)
MCV: 98.9 fL (ref 78.0–100.0)
Platelets: 144 10*3/uL — ABNORMAL LOW (ref 150–400)
RBC: 3.56 MIL/uL — AB (ref 4.22–5.81)
RDW: 14.1 % (ref 11.5–15.5)
WBC: 8.8 10*3/uL (ref 4.0–10.5)

## 2014-04-10 LAB — CBG MONITORING, ED: GLUCOSE-CAPILLARY: 120 mg/dL — AB (ref 70–99)

## 2014-04-10 LAB — URINE MICROSCOPIC-ADD ON

## 2014-04-10 LAB — LITHIUM LEVEL: Lithium Lvl: 1.05 mEq/L (ref 0.80–1.40)

## 2014-04-10 MED ORDER — SODIUM CHLORIDE 0.9 % IV BOLUS (SEPSIS)
1000.0000 mL | Freq: Once | INTRAVENOUS | Status: AC
  Administered 2014-04-10: 1000 mL via INTRAVENOUS

## 2014-04-10 NOTE — ED Notes (Signed)
Family requested to talk with Thayer Ohmhris, [place] . Was concerned that pt did not have x-rays done of his hip.  [facility]. He is in talking with the pt.'s family   Also spoke with Tyler Aasoris the CSW about PT care for the pt. At the group home

## 2014-04-10 NOTE — ED Notes (Signed)
Lactic acid results given to C. Lawyer, PA-C 

## 2014-04-10 NOTE — Discharge Instructions (Signed)
Return here as needed.  Followup with your primary care Dr. home physical therapy will be needed

## 2014-04-10 NOTE — ED Notes (Signed)
Spoke with Lab to request Lithium level to blood sent down. Lab stated they can add Lithium to the blood sent down to them.

## 2014-04-10 NOTE — ED Provider Notes (Signed)
CSN: [id number]     Arrival date & time 04/10/14  4098 History   First MD Initiated Contact with Patient 04/10/14 0848     Chief Complaint  Patient presents with  . Weakness     (Consider location/radiation/quality/duration/timing/severity/associated sxs/prior Treatment) Patient is a 73 y.o. male presenting with weakness.  Weakness Associated symptoms include weakness.   Patient presents to the emergency department following a fall that occurred yesterday.  The patient has had recent falls over the last several months.  Patient lives in a group home and states, that this weakness may be increasing over the last several weeks.  The patient, states, that he doesn't know why he is falling.  Patient denies denies having any pain, but cannot give me any specific due to his MR and dementia nothing seems to make the patient's condition, better or worse.  The patient was scheduled to have home physical therapy, but the company could not see him due to his patient load . Past Medical History  Diagnosis Date  . Mental retardation   . Seizures   . Mood disorder    Past Surgical History  Procedure Laterality Date  . None     History reviewed. No pertinent family history. History  Substance Use Topics  . Smoking status: Never Smoker   . Smokeless tobacco: Not on file  . Alcohol Use: No    Review of Systems  Neurological: Positive for weakness.    Level V caveat applies due to  MR and dementia  Allergies  Review of patient's allergies indicates no known allergies.  Home Medications   Prior to Admission medications   Medication Sig Start Date End Date Taking? Authorizing Provider  acetaminophen (TYLENOL) 325 MG tablet Take 650 mg by mouth every 6 (six) hours as needed. For pain   Yes Historical Provider, MD  benztropine (COGENTIN) 0.5 MG tablet Take 0.5 mg by mouth 2 (two) times daily.   Yes Historical Provider, MD  famotidine (PEPCID) 20 MG tablet Take 20 mg by mouth daily.   Yes  Historical Provider, MD  ketoconazole (NIZORAL) 2 % cream Apply 1 application topically 2 (two) times daily as needed. For ring worm rash   Yes Historical Provider, MD  levETIRAcetam (KEPPRA) 250 MG tablet Take 250 mg by mouth every 12 (twelve) hours.   Yes Historical Provider, MD  lithium carbonate 300 MG capsule Take 300 mg by mouth 2 (two) times daily with a meal.   Yes Historical Provider, MD  LORazepam (ATIVAN) 0.5 MG tablet Take 0.5 mg by mouth every morning.    Yes Historical Provider, MD  memantine (NAMENDA) 10 MG tablet Take 10 mg by mouth 2 (two) times daily.   Yes Historical Provider, MD  oxybutynin (DITROPAN-XL) 10 MG 24 hr tablet Take 10 mg by mouth daily.   Yes Historical Provider, MD  risperiDONE (RISPERDAL) 0.5 MG tablet Take 0.5 mg by mouth daily.   Yes Historical Provider, MD  risperiDONE (RISPERDAL) 2 MG tablet Take 2 mg by mouth at bedtime.   Yes Historical Provider, MD   BP 125/72  Pulse 62  Temp(Src) 97.7 F (36.5 C) (Rectal)  Resp 16  Ht 6\' 1"  (1.854 m)  Wt 220 lb (99.791 kg)  BMI 29.03 kg/m2  SpO2 95% Physical Exam  Nursing note and vitals reviewed. Constitutional: He appears well-developed and well-nourished. No distress.  HENT:  Head: Normocephalic and atraumatic.  Eyes: Pupils are equal, round, and reactive to light.  Neck: Normal range of motion.  Neck supple.  Cardiovascular: Normal rate, regular rhythm and normal heart sounds.  Exam reveals no gallop and no friction rub.   No murmur heard. Pulmonary/Chest: Effort normal and breath sounds normal. No respiratory distress.  Abdominal: Soft. Bowel sounds are normal. He exhibits no distension. There is no tenderness.  Neurological: He is alert. He exhibits normal muscle tone. Coordination normal.  Skin: Skin is warm and dry. No erythema.    ED Course  Procedures (including critical care time) Labs Review Labs Reviewed  CBC - Abnormal; Notable for the following:    RBC 3.56 (*)    Hemoglobin 11.3 (*)     HCT 35.2 (*)    Platelets 144 (*)    All other components within normal limits  BASIC METABOLIC PANEL - Abnormal; Notable for the following:    Glucose, Bld 155 (*)    BUN 28 (*)    Creatinine, Ser 1.61 (*)    GFR calc non Af Amer 41 (*)    GFR calc Af Amer 47 (*)    All other components within normal limits  URINALYSIS, ROUTINE W REFLEX MICROSCOPIC - Abnormal; Notable for the following:    Hgb urine dipstick TRACE (*)    All other components within normal limits  CBG MONITORING, ED - Abnormal; Notable for the following:    Glucose-Capillary 120 (*)    All other components within normal limits  URINE CULTURE  URINE MICROSCOPIC-ADD ON  Rosezena SensorI-STAT TROPOININ, ED  I-STAT CG4 LACTIC ACID, ED    Imaging Review Dg Pelvis 1-2 Views  04/10/2014   CLINICAL DATA:  fall and pain  EXAM: PELVIS - 1-2 VIEW  COMPARISON:  None.  FINDINGS: There is no evidence of pelvic fracture or diastasis. Joint space narrowing and mild periarticular sclerosis within the hips. Phleboliths within the pelvis.  IMPRESSION: Mild osteoarthritic changes.  No acute osseous abnormalities.   Electronically Signed   By: Salome HolmesHector  Cooper M.D.   On: 04/10/2014 15:00   Ct Head Wo Contrast  04/10/2014   CLINICAL DATA:  Fall  EXAM: CT HEAD WITHOUT CONTRAST  TECHNIQUE: Contiguous axial images were obtained from the base of the skull through the vertex without intravenous contrast.  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 No acute infarct. Negative for hemorrhage or mass. Ventricle size is normal.  Well-circumscribed lytic lesion in the left occipital bone is stable and appears benign, probable arachnoid granulation.  Extensive mucosal thickening in the paranasal sinuses as seen previously.  IMPRESSION: No acute abnormality.   Electronically Signed   By: Marlan Palauharles  Clark M.D.   On: 04/10/2014 12:22   Dg Chest Portable 1 View  04/10/2014   CLINICAL DATA:  Weakness  EXAM: PORTABLE CHEST - 1  VIEW  COMPARISON:  03/14/2013  FINDINGS: Cardiac enlargement without heart failure. Hypoventilation with mild bibasilar atelectasis. Negative for pneumonia or effusion.  IMPRESSION: Hypoventilation with mild bibasilar atelectasis.   Electronically Signed   By: Marlan Palauharles  Clark M.D.   On: 04/10/2014 09:07     EKG Interpretation   Date/Time:  Wednesday April 10 2014 08:43:23 EDT Ventricular Rate:  71 PR Interval:  229 QRS Duration: 108 QT Interval:  401 QTC Calculation: 436 R Axis:   -6 Text Interpretation:  Sinus rhythm with 1st degree A-V block Left axis  deviation Abnormal R-wave progression, early transition When compared with  ECG of 03/14/2013 No significant change was found Confirmed by [facility]   MD, Nicholos JohnsKATHLEEN [phone]([id number]) on 04/10/2014 9:10:41  AM      Patient has a normal workup here in the emergency department.  Our social worker is coordinating home health care for the patient.patient will be sent back to the group home and asked to be evaluated by his primary care Dr.   Carlyle Dollyhristopher W Lawyer, PA-C 04/10/14 1531

## 2014-04-10 NOTE — Progress Notes (Signed)
  CARE MANAGEMENT ED NOTE 04/10/2014  Patient:  Alice ReichertHUNTER,Shriyans   Account Number:  [id number]  Date Initiated:  04/10/2014  Documentation initiated by:  Ferdinand CavaSCHETTINO,ANDREA  Subjective/Objective Assessment:   73 yo male with Medicare/Medicaid presenting to the ED from the group home     Subjective/Objective Assessment Detail:   Patient residing in group home and having increased falls recently.     Action/Plan:   [facility]   Action/Plan Detail:   Anticipated DC Date:       Status Recommendation to Physician:   Result of Recommendation:  Agreed      Choice offered to / List presented to:       [facility]  [facility]      [facility]  [facility]    Status of service:  Completed, signed off  ED Comments:   ED Comments Detail:  CM consulted by ED CSW for home health needs. This CM spoke with the patient's group home case worker, Clarisse GougeBridget, and the patient's brother Nida BoatmanBrad. Clarisse GougeBridget stated that the patient has been have balance and safety issues recently in the group home and has several falls recently and they are concerned about his safety. Clarisse GougeBridget stated that [facility] in the past and received services. They stated that [facility] and would like to use their services again. Ebbie Ridgehris Lawyer PA was updated on the request for PT and he [facility] an [facility] and face to face was completed. This CM spoke with Hilda LiasMarie, [facility], and she stated that a PT will contact the patient within 48 hours. Clarisse GougeBridget and BethelBrad were updated on the order placed and they should be contacted within 48 hours by [facility]. This CM provided contact information if any further assist is needed after discharge. Clarisse GougeBridget and Brad verbalized understanding and appreciation for services provided.

## 2014-04-10 NOTE — ED Notes (Signed)
Per PT[facility], pt is from group home. Has hx of MR, seizures, and dementia. Pt has had increased weakness for 2 days. This morning pt started falling and staff caught him and lowered him to the floor. Pt is alert and responsive to group home staff member who is at the bedside. NAD at this time. Pt has no hx of diabetes but CBG was 259 with [facility].

## 2014-04-10 NOTE — ED Notes (Signed)
EKG completed given to EDP.  

## 2014-04-10 NOTE — ED Notes (Signed)
Rectal temp. 97.7.

## 2014-04-10 NOTE — ED Notes (Signed)
Patient unable to void at this time

## 2014-04-10 NOTE — ED Notes (Signed)
Reviewed instructions with pt.s caregiver. They verbalized understanding

## 2014-04-11 LAB — URINE CULTURE
Colony Count: NO GROWTH
Culture: NO GROWTH

## 2014-04-12 NOTE — ED Provider Notes (Signed)
Medical screening examination/treatment/procedure(s) were performed by non-physician practitioner and as supervising physician I was immediately available for consultation/collaboration.   EKG Interpretation   Date/Time:  Wednesday April 10 2014 08:43:23 EDT Ventricular Rate:  71 PR Interval:  229 QRS Duration: 108 QT Interval:  401 QTC Calculation: 436 R Axis:   -6 Text Interpretation:  Sinus rhythm with 1st degree A-V block Left axis  deviation Abnormal R-wave progression, early transition When compared with  ECG of 03/14/2013 No significant change was found Confirmed by [id number]   MD, KATHLEEN ([id number]) on 04/10/2014 9:10:41 AM        Laray AngerKathleen M McManus, DO 04/12/14 [id number]

## 2014-04-17 ENCOUNTER — Encounter: Payer: Self-pay | Admitting: Neurology

## 2014-04-17 ENCOUNTER — Ambulatory Visit (INDEPENDENT_AMBULATORY_CARE_PROVIDER_SITE_OTHER): Payer: PRIVATE HEALTH INSURANCE | Admitting: Neurology

## 2014-04-17 VITALS — BP 121/74 | HR 57 | Ht 69.0 in | Wt 215.0 lb

## 2014-04-17 DIAGNOSIS — G40401 Other generalized epilepsy and epileptic syndromes, not intractable, with status epilepticus: Secondary | ICD-10-CM

## 2014-04-17 DIAGNOSIS — R569 Unspecified convulsions: Secondary | ICD-10-CM

## 2014-04-17 DIAGNOSIS — G40301 Generalized idiopathic epilepsy and epileptic syndromes, not intractable, with status epilepticus: Secondary | ICD-10-CM

## 2014-04-17 DIAGNOSIS — G40909 Epilepsy, unspecified, not intractable, without status epilepticus: Secondary | ICD-10-CM

## 2014-04-17 DIAGNOSIS — R269 Unspecified abnormalities of gait and mobility: Secondary | ICD-10-CM

## 2014-04-17 MED ORDER — LEVETIRACETAM 250 MG PO TABS: 250.0000 mg | ORAL_TABLET | Freq: Two times a day (BID) | ORAL | Status: DC

## 2014-04-17 NOTE — Progress Notes (Signed)
PATIENT: Shawn Wilcox DOB: [birth year]  HISTORICAL  Shawn Wilcox is a 73 years old right-handed Caucasian male,came in to  followup for his epilepsy disorder, he is accompanied by his caregiver, Shawn Wilcox, who has known him since 2011, his primary care physician is Dr. Juluis RainierElizabeth Wilcox.  He was last seen in July 2014 by Eber Jonesarolyn.   He was born with mental retardation, had epilepsy since age 73, forceful head deviation to left, followed by tonic clonic activity, he has multiple recurrent episodes for one year, but since he was on dilantin 100mg ,he no longer has seizure. In 2011, he was switched from Dilantin to Keppra 250 mg twice a day, he has no recurrent seizure.   He is also seeing a psychiatrist for his mood disorder, is taking lithium, Risperdal.  There have been no issues with behavior, no falls. He goes to a day program.   He has gradual onset unsteady gait for few years, falling multiple times recently, is receiving physical therapy.  It is difficult to get a detailed history from patient, but he denies significant pain, occasionally incontinence if he has to wait in line, he did not complain and all feet paresthesia, he has good appetite, sleeps well,    REVIEW OF SYSTEMS: Full 14 system review of systems performed and notable only for gait difficulty,  ALLERGIES: No Known Allergies  HOME MEDICATIONS: Current Outpatient Prescriptions on File Prior to Visit  Medication Sig Dispense Refill  . acetaminophen (TYLENOL) 325 MG tablet Take 650 mg by mouth every 6 (six) hours as needed. For pain      . benztropine (COGENTIN) 0.5 MG tablet Take 0.5 mg by mouth 2 (two) times daily.      . famotidine (PEPCID) 20 MG tablet Take 20 mg by mouth daily.      Marland Kitchen. ketoconazole (NIZORAL) 2 % cream Apply 1 application topically 2 (two) times daily as needed. For ring worm rash      . levETIRAcetam (KEPPRA) 250 MG tablet Take 250 mg by mouth every 12 (twelve) hours.      Marland Kitchen. lithium carbonate 300 MG  capsule Take 300 mg by mouth 2 (two) times daily with a meal.      . LORazepam (ATIVAN) 0.5 MG tablet Take 0.5 mg by mouth every morning.       . memantine (NAMENDA) 10 MG tablet Take 10 mg by mouth 2 (two) times daily.      Marland Kitchen. oxybutynin (DITROPAN-XL) 10 MG 24 hr tablet Take 10 mg by mouth daily.      . risperiDONE (RISPERDAL) 0.5 MG tablet Take 0.5 mg by mouth daily.      . risperiDONE (RISPERDAL) 2 MG tablet Take 2 mg by mouth at bedtime.       No current facility-administered medications on file prior to visit.    PAST MEDICAL HISTORY: Past Medical History  Diagnosis Date  . Mental retardation   . Seizures   . Mood disorder     PAST SURGICAL HISTORY: Past Surgical History  Procedure Laterality Date  . None      FAMILY HISTORY: History reviewed. No pertinent family history.  SOCIAL HISTORY:  History   Social History  . Marital Status: Single    Spouse Name: N/A    Number of Children: 0  . Years of Education: N/A   Occupational History  . Not on file.   Social History Main Topics  . Smoking status: Never Smoker   . Smokeless tobacco: Never Used  .  Alcohol Use: No  . Drug Use: No  . Sexual Activity: Not on file   Other Topics Concern  . Not on file   Social History Narrative   ** Merged History Encounter **         PHYSICAL EXAM   Filed Vitals:   04/17/14 1150  BP: 121/74  Pulse: 57  Height: 5\' 9"  (1.753 m)  Weight: 215 lb (97.523 kg)    Not recorded    Body mass index is 31.74 kg/(m^2).   Generalized: In no acute distress  Neck: Supple, no carotid bruits   Cardiac: Regular rate rhythm  Pulmonary: Clear to auscultation bilaterally  Musculoskeletal: No deformity  Neurological examination  Mentation: Depending on his caregiver to provide history, cooperative, but difficult to follow three-step neurological examination  Cranial nerve II-XII: Pupils were equal round reactive to light. Extraocular movements were full.  Visual field were  full on confrontational test. Facial sensation and strength were normal. Hearing was intact to finger rubbing bilaterally. Uvula tongue midline.  Head turning and shoulder shrug and were normal and symmetric.Tongue protrusion into cheek strength was normal.  Motor: Normal tone, bulk and strength.  Sensory: Withdraws to pain  Coordination: Normal finger to nose, he has dysmetria at bilateral heel-to-shin.  Gait: Rising up from seated position pushing on a chair arm, wide base, ataxic gait,  Romberg signs: Negative  Deep tendon reflexes: Brachioradialis 2/2, biceps 2/2, triceps 2/2, patellar 2/2, Achilles 1/1, plantar responses were flexor bilaterally.   DIAGNOSTIC DATA (LABS, IMAGING, TESTING) - I reviewed patient records, labs, notes, testing and imaging myself where available.  Lab Results  Component Value Date   WBC 8.8 04/10/2014   HGB 11.3* 04/10/2014   HCT 35.2* 04/10/2014   MCV 98.9 04/10/2014   PLT 144* 04/10/2014      Component Value Date/Time   NA 141 04/10/2014 0840   K 4.1 04/10/2014 0840   CL 107 04/10/2014 0840   CO2 21 04/10/2014 0840   GLUCOSE 155* 04/10/2014 0840   BUN 28* 04/10/2014 0840   CREATININE 1.61* 04/10/2014 0840   CALCIUM 9.0 04/10/2014 0840   PROT 6.5 03/14/2013 1410   ALBUMIN 3.4* 03/14/2013 1410   AST 35 03/14/2013 1410   ALT 16 03/14/2013 1410   ALKPHOS 105 03/14/2013 1410   BILITOT 0.5 03/14/2013 1410   GFRNONAA 41* 04/10/2014 0840   GFRAA 47* 04/10/2014 0840   ASSESSMENT AND PLAN  Shawn Wilcox is a 73 y.o. male with past medical history of mental retardation, epilepsy, doing very well with current dose of Keppra 250 mg twice a day, he has no recurrent seizure, most bothersome symptoms is his gradual worsening gait difficulty, falling episode, on examination, he has ataxic gait, bilateral lower extremity dysmetria, I would localize the lesion to  brain stem/cerebellum.  After discuss with his caregiver, we decided to hold off further evaluation, continue  physical therapy, encourage patient use walker  Return to clinic in one year with [name]   [name], M.D. Ph.D.  [facility] [address], [address] [place], [place] [address] [phone]

## 2014-07-06 ENCOUNTER — Encounter (HOSPITAL_COMMUNITY): Payer: Self-pay | Admitting: Emergency Medicine

## 2014-07-06 ENCOUNTER — Emergency Department (HOSPITAL_COMMUNITY): Payer: PRIVATE HEALTH INSURANCE

## 2014-07-06 ENCOUNTER — Inpatient Hospital Stay (HOSPITAL_COMMUNITY)
Admission: EM | Admit: 2014-07-06 | Discharge: 2014-07-11 | DRG: 682 | Disposition: A | Discharge status: Skilled Nursing Facility | Payer: PRIVATE HEALTH INSURANCE | Attending: Internal Medicine | Admitting: Internal Medicine

## 2014-07-06 DIAGNOSIS — I2789 Other specified pulmonary heart diseases: Secondary | ICD-10-CM | POA: Diagnosis present

## 2014-07-06 DIAGNOSIS — R269 Unspecified abnormalities of gait and mobility: Secondary | ICD-10-CM

## 2014-07-06 DIAGNOSIS — I959 Hypotension, unspecified: Secondary | ICD-10-CM

## 2014-07-06 DIAGNOSIS — G9340 Encephalopathy, unspecified: Secondary | ICD-10-CM | POA: Diagnosis present

## 2014-07-06 DIAGNOSIS — R531 Weakness: Secondary | ICD-10-CM

## 2014-07-06 DIAGNOSIS — I10 Essential (primary) hypertension: Secondary | ICD-10-CM | POA: Diagnosis present

## 2014-07-06 DIAGNOSIS — Z9181 History of falling: Secondary | ICD-10-CM

## 2014-07-06 DIAGNOSIS — R4182 Altered mental status, unspecified: Secondary | ICD-10-CM

## 2014-07-06 DIAGNOSIS — G40909 Epilepsy, unspecified, not intractable, without status epilepticus: Secondary | ICD-10-CM | POA: Diagnosis present

## 2014-07-06 DIAGNOSIS — G40309 Generalized idiopathic epilepsy and epileptic syndromes, not intractable, without status epilepticus: Secondary | ICD-10-CM | POA: Diagnosis present

## 2014-07-06 DIAGNOSIS — G40319 Generalized idiopathic epilepsy and epileptic syndromes, intractable, without status epilepticus: Secondary | ICD-10-CM

## 2014-07-06 DIAGNOSIS — F79 Unspecified intellectual disabilities: Secondary | ICD-10-CM | POA: Diagnosis present

## 2014-07-06 DIAGNOSIS — F39 Unspecified mood [affective] disorder: Secondary | ICD-10-CM | POA: Diagnosis present

## 2014-07-06 DIAGNOSIS — R569 Unspecified convulsions: Secondary | ICD-10-CM

## 2014-07-06 DIAGNOSIS — R5381 Other malaise: Secondary | ICD-10-CM

## 2014-07-06 DIAGNOSIS — R5383 Other fatigue: Secondary | ICD-10-CM

## 2014-07-06 DIAGNOSIS — F0390 Unspecified dementia without behavioral disturbance: Secondary | ICD-10-CM | POA: Diagnosis present

## 2014-07-06 DIAGNOSIS — E860 Dehydration: Secondary | ICD-10-CM | POA: Diagnosis present

## 2014-07-06 DIAGNOSIS — R55 Syncope and collapse: Secondary | ICD-10-CM

## 2014-07-06 DIAGNOSIS — N179 Acute kidney failure, unspecified: Secondary | ICD-10-CM | POA: Diagnosis present

## 2014-07-06 DIAGNOSIS — N189 Chronic kidney disease, unspecified: Secondary | ICD-10-CM

## 2014-07-06 LAB — CBC WITH DIFFERENTIAL/PLATELET
Basophils Absolute: 0 10*3/uL (ref 0.0–0.1)
Basophils Relative: 0 % (ref 0–1)
EOS ABS: 0.1 10*3/uL (ref 0.0–0.7)
Eosinophils Relative: 2 % (ref 0–5)
HCT: 34.4 % — ABNORMAL LOW (ref 39.0–52.0)
Hemoglobin: 11.5 g/dL — ABNORMAL LOW (ref 13.0–17.0)
LYMPHS ABS: 0.6 10*3/uL — AB (ref 0.7–4.0)
Lymphocytes Relative: 12 % (ref 12–46)
MCH: 31.9 pg (ref 26.0–34.0)
MCHC: 33.4 g/dL (ref 30.0–36.0)
MCV: 95.6 fL (ref 78.0–100.0)
MONOS PCT: 9 % (ref 3–12)
Monocytes Absolute: 0.4 10*3/uL (ref 0.1–1.0)
NEUTROS PCT: 77 % (ref 43–77)
Neutro Abs: 3.7 10*3/uL (ref 1.7–7.7)
Platelets: 124 10*3/uL — ABNORMAL LOW (ref 150–400)
RBC: 3.6 MIL/uL — AB (ref 4.22–5.81)
RDW: 13.3 % (ref 11.5–15.5)
WBC: 4.7 10*3/uL (ref 4.0–10.5)

## 2014-07-06 LAB — COMPREHENSIVE METABOLIC PANEL
ALT: 15 U/L (ref 0–53)
ANION GAP: 15 (ref 5–15)
AST: 23 U/L (ref 0–37)
Albumin: 3.1 g/dL — ABNORMAL LOW (ref 3.5–5.2)
Alkaline Phosphatase: 85 U/L (ref 39–117)
BUN: 41 mg/dL — AB (ref 6–23)
CALCIUM: 8.5 mg/dL (ref 8.4–10.5)
CO2: 18 mEq/L — ABNORMAL LOW (ref 19–32)
Chloride: 103 mEq/L (ref 96–112)
Creatinine, Ser: 2.17 mg/dL — ABNORMAL HIGH (ref 0.50–1.35)
GFR calc non Af Amer: 28 mL/min — ABNORMAL LOW (ref >90)
GFR, EST AFRICAN AMERICAN: 33 mL/min — AB (ref >90)
GLUCOSE: 107 mg/dL — AB (ref 70–99)
Potassium: 3.6 mEq/L — ABNORMAL LOW (ref 3.7–5.3)
SODIUM: 136 meq/L — AB (ref 137–147)
TOTAL PROTEIN: 6.3 g/dL (ref 6.0–8.3)
Total Bilirubin: 0.3 mg/dL (ref 0.3–1.2)

## 2014-07-06 LAB — URINALYSIS, ROUTINE W REFLEX MICROSCOPIC
Bilirubin Urine: NEGATIVE
Glucose, UA: NEGATIVE mg/dL
Hgb urine dipstick: NEGATIVE
Ketones, ur: NEGATIVE mg/dL
Leukocytes, UA: NEGATIVE
NITRITE: NEGATIVE
Protein, ur: NEGATIVE mg/dL
SPECIFIC GRAVITY, URINE: 1.016 (ref 1.005–1.030)
UROBILINOGEN UA: 0.2 mg/dL (ref 0.0–1.0)
pH: 5 (ref 5.0–8.0)

## 2014-07-06 LAB — LITHIUM LEVEL: LITHIUM LVL: 1.02 meq/L (ref 0.80–1.40)

## 2014-07-06 LAB — LIPASE, BLOOD: Lipase: 14 U/L (ref 11–59)

## 2014-07-06 LAB — I-STAT TROPONIN, ED: Troponin i, poc: 0.04 ng/mL (ref 0.00–0.08)

## 2014-07-06 LAB — LACTIC ACID, PLASMA: Lactic Acid, Venous: 1.1 mmol/L (ref 0.5–2.2)

## 2014-07-06 MED ORDER — SODIUM CHLORIDE 0.9 % IV SOLN: INTRAVENOUS | Status: DC

## 2014-07-06 MED ORDER — LITHIUM CARBONATE 300 MG PO CAPS
300.0000 mg | ORAL_CAPSULE | Freq: Two times a day (BID) | ORAL | Status: DC
  Administered 2014-07-06 – 2014-07-11 (×10): 300 mg via ORAL
  Filled 2014-07-06 (×13): qty 1

## 2014-07-06 MED ORDER — HEPARIN SODIUM (PORCINE) 5000 UNIT/ML IJ SOLN
5000.0000 [IU] | Freq: Three times a day (TID) | INTRAMUSCULAR | Status: DC
  Administered 2014-07-06 – 2014-07-11 (×15): 5000 [IU] via SUBCUTANEOUS
  Filled 2014-07-06 (×16): qty 1

## 2014-07-06 MED ORDER — LEVETIRACETAM 250 MG PO TABS
250.0000 mg | ORAL_TABLET | Freq: Two times a day (BID) | ORAL | Status: DC
  Administered 2014-07-06 – 2014-07-11 (×9): 250 mg via ORAL
  Filled 2014-07-06 (×11): qty 1

## 2014-07-06 MED ORDER — ONDANSETRON HCL 4 MG PO TABS: 4.0000 mg | ORAL_TABLET | Freq: Four times a day (QID) | ORAL | Status: DC | PRN

## 2014-07-06 MED ORDER — RISPERIDONE 0.5 MG PO TABS
0.5000 mg | ORAL_TABLET | Freq: Every day | ORAL | Status: DC
  Administered 2014-07-07 – 2014-07-11 (×5): 0.5 mg via ORAL
  Filled 2014-07-06 (×5): qty 1

## 2014-07-06 MED ORDER — SODIUM CHLORIDE 0.9 % IJ SOLN
3.0000 mL | Freq: Two times a day (BID) | INTRAMUSCULAR | Status: DC
  Administered 2014-07-06 – 2014-07-11 (×8): 3 mL via INTRAVENOUS

## 2014-07-06 MED ORDER — INFLUENZA VAC SPLIT QUAD 0.5 ML IM SUSY
0.5000 mL | PREFILLED_SYRINGE | INTRAMUSCULAR | Status: AC
  Administered 2014-07-08: 0.5 mL via INTRAMUSCULAR
  Filled 2014-07-06: qty 0.5

## 2014-07-06 MED ORDER — SODIUM CHLORIDE 0.9 % IV SOLN
INTRAVENOUS | Status: DC
  Administered 2014-07-06: 17:00:00 via INTRAVENOUS

## 2014-07-06 MED ORDER — MEMANTINE HCL 10 MG PO TABS
10.0000 mg | ORAL_TABLET | Freq: Two times a day (BID) | ORAL | Status: DC
  Administered 2014-07-07 – 2014-07-11 (×9): 10 mg via ORAL
  Filled 2014-07-06 (×12): qty 1

## 2014-07-06 MED ORDER — ACETAMINOPHEN 325 MG PO TABS: 650.0000 mg | ORAL_TABLET | Freq: Four times a day (QID) | ORAL | Status: DC | PRN

## 2014-07-06 MED ORDER — RISPERIDONE 2 MG PO TABS
2.0000 mg | ORAL_TABLET | Freq: Every day | ORAL | Status: DC
  Administered 2014-07-06 – 2014-07-09 (×4): 2 mg via ORAL
  Filled 2014-07-06 (×6): qty 1

## 2014-07-06 MED ORDER — BENZTROPINE MESYLATE 0.5 MG PO TABS
0.5000 mg | ORAL_TABLET | Freq: Two times a day (BID) | ORAL | Status: DC
  Administered 2014-07-06 – 2014-07-11 (×9): 0.5 mg via ORAL
  Filled 2014-07-06 (×11): qty 1

## 2014-07-06 MED ORDER — OXYBUTYNIN CHLORIDE ER 10 MG PO TB24
10.0000 mg | ORAL_TABLET | Freq: Every day | ORAL | Status: DC
  Administered 2014-07-07 – 2014-07-11 (×5): 10 mg via ORAL
  Filled 2014-07-06 (×5): qty 1

## 2014-07-06 MED ORDER — FAMOTIDINE 20 MG PO TABS
20.0000 mg | ORAL_TABLET | Freq: Every day | ORAL | Status: DC
  Administered 2014-07-07 – 2014-07-11 (×5): 20 mg via ORAL
  Filled 2014-07-06 (×5): qty 1

## 2014-07-06 MED ORDER — ACETAMINOPHEN 650 MG RE SUPP: 650.0000 mg | Freq: Four times a day (QID) | RECTAL | Status: DC | PRN

## 2014-07-06 MED ORDER — ONDANSETRON HCL 4 MG/2ML IJ SOLN
4.0000 mg | Freq: Four times a day (QID) | INTRAMUSCULAR | Status: DC | PRN
  Administered 2014-07-10: 4 mg via INTRAVENOUS
  Filled 2014-07-06: qty 2

## 2014-07-06 MED ORDER — SODIUM CHLORIDE 0.9 % IV SOLN
INTRAVENOUS | Status: AC
  Administered 2014-07-06 – 2014-07-08 (×2): via INTRAVENOUS

## 2014-07-06 MED ORDER — LORAZEPAM 0.5 MG PO TABS
0.5000 mg | ORAL_TABLET | ORAL | Status: DC
  Administered 2014-07-07 – 2014-07-11 (×4): 0.5 mg via ORAL
  Filled 2014-07-06 (×5): qty 1

## 2014-07-06 MED ORDER — SODIUM CHLORIDE 0.9 % IV BOLUS (SEPSIS)
500.0000 mL | Freq: Once | INTRAVENOUS | Status: AC
  Administered 2014-07-06: 500 mL via INTRAVENOUS

## 2014-07-06 NOTE — ED Provider Notes (Signed)
7:00 PM  Assumed care from Dr. Saul Fordyce.  Pt is a 73 y.o. male with history of mental retardation and seizure disorder who presents to the emergency department from his group home with possible syncopal event versus fall yesterday, altered status and another fall today. He was initially hypotensive with EMS but this improved to 700 mL of IV fluid. Here his workup shows acute on chronic renal failure, bicarbonate 18 that is likely from uremia, normal anion gap. Head CT, cervical spine CT, chest x-ray, pelvic x-ray is negative. Urine shows no sign of infection. He has received another 500 mg a fluid bolus in the ED. He is getting a continuous IV infusion of 100 mL of normal saline an hour. Discuss with hospitalist, Dr. Betti Cruz for admission to inpt, tele.  Layla Maw Iriana Artley, DO 07/06/14 [id number]

## 2014-07-06 NOTE — ED Notes (Signed)
Patient transported to X-ray 

## 2014-07-06 NOTE — ED Notes (Signed)
Attempt report 

## 2014-07-06 NOTE — ED Notes (Signed)
Patient transported to CT 

## 2014-07-06 NOTE — ED Provider Notes (Signed)
CSN: [id number]     Arrival date & time 07/06/14  1511 History   First MD Initiated Contact with Patient 07/06/14 1514     Chief Complaint  Patient presents with  . Altered Mental Status     (Consider location/radiation/quality/duration/timing/severity/associated sxs/prior Treatment) Patient is a 73 y.o. male presenting with altered mental status. The history is provided by the patient, the EMS personnel and a caregiver. The history is limited by the condition of the patient.  Altered Mental Status  patient from group home. Has a history of MRCP or similar disorder. Patient had a syncopal episode last evening. In another fall today. Patient not very alert today and dairy sleepy not very talkative not as active as he normally is. Patient was noted to have low blood pressure at the nursing facility systolic of 80. Patient received 700 cc of normal saline via EMS in route. Here blood pressures 125/60. Patient did have a fall today. Patient is awake will follow commands.  Past Medical History  Diagnosis Date  . Mental retardation   . Seizures   . Mood disorder    Past Surgical History  Procedure Laterality Date  . None     History reviewed. No pertinent family history. History  Substance Use Topics  . Smoking status: Never Smoker   . Smokeless tobacco: Never Used  . Alcohol Use: No    Review of Systems  Unable to perform ROS  level V caveat applies to the review of systems due to his altered mental status.    Allergies  Review of patient's allergies indicates no known allergies.  Home Medications   Prior to Admission medications   Medication Sig Start Date End Date Taking? Authorizing Provider  acetaminophen (TYLENOL) 325 MG tablet Take 650 mg by mouth every 6 (six) hours as needed. For pain   Yes Historical Provider, MD  benztropine (COGENTIN) 0.5 MG tablet Take 0.5 mg by mouth 2 (two) times daily.   Yes Historical Provider, MD  famotidine (PEPCID) 20 MG tablet Take 20 mg  by mouth daily.   Yes Historical Provider, MD  ketoconazole (NIZORAL) 2 % cream Apply 1 application topically 2 (two) times daily as needed. For ring worm rash   Yes Historical Provider, MD  levETIRAcetam (KEPPRA) 250 MG tablet Take 250 mg by mouth 2 (two) times daily.   Yes Historical Provider, MD  lithium carbonate 300 MG capsule Take 300 mg by mouth 2 (two) times daily with a meal.   Yes Historical Provider, MD  LORazepam (ATIVAN) 0.5 MG tablet Take 0.5 mg by mouth every morning.    Yes Historical Provider, MD  memantine (NAMENDA) 10 MG tablet Take 10 mg by mouth 2 (two) times daily.   Yes Historical Provider, MD  oxybutynin (DITROPAN-XL) 10 MG 24 hr tablet Take 10 mg by mouth daily.   Yes Historical Provider, MD  risperiDONE (RISPERDAL) 0.5 MG tablet Take 0.5 mg by mouth daily.   Yes Historical Provider, MD  risperiDONE (RISPERDAL) 2 MG tablet Take 2 mg by mouth at bedtime.   Yes Historical Provider, MD   BP 104/69  Pulse 75  Temp(Src) 98.5 F (36.9 C) (Oral)  Resp 24  SpO2 100% Physical Exam  Nursing note and vitals reviewed. Constitutional: He appears well-developed and well-nourished. No distress.  HENT:  Head: Normocephalic and atraumatic.  Mucous membranes dry.  Eyes: Conjunctivae and EOM are normal. Pupils are equal, round, and reactive to light.  Neck: Normal range of motion.  Cardiovascular: Normal  rate, regular rhythm and normal heart sounds.   No murmur heard. Pulmonary/Chest: Effort normal and breath sounds normal. No respiratory distress.  Abdominal: Soft. Bowel sounds are normal. There is no tenderness.  Musculoskeletal: Normal range of motion. He exhibits no tenderness.  Neurological: He is alert. No cranial nerve deficit. He exhibits normal muscle tone. Coordination normal.  Patient is awake and will follow commands no obvious focal neuro deficits. Denies any complaints.  Skin: Skin is warm. No rash noted.    ED Course  Procedures (including critical care  time) Labs Review Labs Reviewed  URINE CULTURE  CULTURE, BLOOD (ROUTINE X 2)  CULTURE, BLOOD (ROUTINE X 2)  COMPREHENSIVE METABOLIC PANEL  URINALYSIS, ROUTINE W REFLEX MICROSCOPIC  LACTIC ACID, PLASMA  CBC WITH DIFFERENTIAL  LIPASE, BLOOD  I-STAT TROPOININ, ED   Results for orders placed during the hospital encounter of 07/06/14  I-STAT TROPOININ, ED      Result Value Ref Range   Troponin i, poc 0.04  0.00 - 0.08 ng/mL   Comment 3              Imaging Review Ct Head Wo Contrast  07/06/2014   CLINICAL DATA:  Altered mental status ; near syncopal episode  EXAM: CT HEAD WITHOUT CONTRAST  CT CERVICAL SPINE WITHOUT CONTRAST  TECHNIQUE: Multidetector CT imaging of the head and cervical spine was performed following the standard protocol without intravenous contrast. Multiplanar CT image reconstructions of the cervical spine were also generated.  COMPARISON:  Noncontrast CT scan of the brain of April 10, 2014  FINDINGS: CT HEAD FINDINGS  The ventricles are normal in size and position. There is no intracranial hemorrhage nor intracranial mass effect. There is no acute ischemic change. The cerebellum and brainstem exhibit no acute abnormalities.  There is persistent mucoperiosteal thickening within frontal, ethmoid, sphenoid, and maxillary sinuses. There are no air-fluid levels. The mastoid air cells are well pneumatized. There is no acute skull fracture. A stable lytic lesion in the left paramedian occipital bone is noted.  CT CERVICAL SPINE FINDINGS  The cervical vertebral bodies are preserved in height. There is stable fusion across the C6-7 disc. The prevertebral soft tissue spaces are normal. There is no perched facet. The odontoid is intact. The pulmonary apices are clear. The thyroid gland demonstrates heterogeneous density and subtle nodularity with one nodule on the left measuring as much as 1 cm in greatest dimension.  IMPRESSION: 1. There is no acute intracranial hemorrhage, acute ischemic  change, nor other acute abnormality of the brain. 2. Stable pan sinus inflammation. 3. There is stable fusion across the C6-7 disc. There is no acute cervical spine fracture nor dislocation. 4. Nodularity of the thyroid gland. Elective follow-up thyroid ultrasound is recommended.   Electronically Signed   By: David  [place]   On: 07/06/2014 16:26   Ct Cervical Spine Wo Contrast  07/06/2014   CLINICAL DATA:  Altered mental status ; near syncopal episode  EXAM: CT HEAD WITHOUT CONTRAST  CT CERVICAL SPINE WITHOUT CONTRAST  TECHNIQUE: Multidetector CT imaging of the head and cervical spine was performed following the standard protocol without intravenous contrast. Multiplanar CT image reconstructions of the cervical spine were also generated.  COMPARISON:  Noncontrast CT scan of the brain of April 10, 2014  FINDINGS: CT HEAD FINDINGS  The ventricles are normal in size and position. There is no intracranial hemorrhage nor intracranial mass effect. There is no acute ischemic change. The cerebellum and brainstem exhibit no acute abnormalities.  There is persistent mucoperiosteal thickening within frontal, ethmoid, sphenoid, and maxillary sinuses. There are no air-fluid levels. The mastoid air cells are well pneumatized. There is no acute skull fracture. A stable lytic lesion in the left paramedian occipital bone is noted.  CT CERVICAL SPINE FINDINGS  The cervical vertebral bodies are preserved in height. There is stable fusion across the C6-7 disc. The prevertebral soft tissue spaces are normal. There is no perched facet. The odontoid is intact. The pulmonary apices are clear. The thyroid gland demonstrates heterogeneous density and subtle nodularity with one nodule on the left measuring as much as 1 cm in greatest dimension.  IMPRESSION: 1. There is no acute intracranial hemorrhage, acute ischemic change, nor other acute abnormality of the brain. 2. Stable pan sinus inflammation. 3. There is stable fusion across the C6-7  disc. There is no acute cervical spine fracture nor dislocation. 4. Nodularity of the thyroid gland. Elective follow-up thyroid ultrasound is recommended.   Electronically Signed   By: David  [place]   On: 07/06/2014 16:26     EKG Interpretation   Date/Time:  Saturday July 06 2014 15:29:22 EDT Ventricular Rate:  80 PR Interval:  209 QRS Duration: 103 QT Interval:  359 QTC Calculation: 414 R Axis:   -12 Text Interpretation:  Sinus rhythm Atrial premature complex Abnormal  R-wave progression, early transition Borderline repolarization abnormality  No significant change since last tracing Confirmed by Mackensie Pilson  MD, Ernesha Ramone  ([id number]) on 07/06/2014 3:37:16 PM      MDM   Final diagnoses:  Altered mental status, unspecified altered mental status type    Patient brought in from group home. Patient with change in his normal health yesterday. Today patient with sleepiness not very active gait off. And supposedly had hypotension at the nursing facility. With a systolic of 80. Here blood pressures have been consistently above 100 systolic. Workup is for altered mental status post there was a fall yesterday and today when trying to ambulate. Patient has a history of marked has a history of seizures.  Patient's head CT and CT of neck without any significant abnormalities. EKG without any acute changes. Patient's troponin is negative. Rest of workup is still pending. Obviously this includes some concern for sepsis but patient's not tachycardic not febrile. Blood cultures were obtained. Chest x-ray is still pending. Also x-rays of bilateral hips obtained due to falls. Urinalysis is still pending but very suspect for possible source of infection as well as the lungs.    Vanetta Mulders, MD 07/06/14 1715

## 2014-07-06 NOTE — ED Notes (Signed)
Pt assisted to standing position to urinate. Unsteady on feet. 2 person assistance needed when standing.

## 2014-07-06 NOTE — ED Notes (Addendum)
Caretaker at bedside reports pt is normally more alert and active than today and yesterday. She reports she received a call from staff this morning reporting pt had fallen this am after arriving [facility] the adult day center. Pt denies any pain. Pt has a hx of MR, pt states he does not remember falling. Pt states he feels fine, denies any concerns. Pt is alert and oriented, a little slow to respond but answers questions appropriately and follows all commands.

## 2014-07-06 NOTE — H&P (Addendum)
Patient's PCP: Gaye Alken, MD  Chief Complaint: Altered mental status and hypotension  History of Present Illness: Shawn Wilcox is a 73 y.o. Caucasian male with history of mental retardation is in a group home, seizure disorder, and mood disorder who presents with the above complaints.  Patient cannot provide any history at this time.  Most of the history was obtained from caretaker at bedside.  Patient over the last week has been noted to be more sluggish and tired with delayed responses.  Yesterday evening he was sitting and he slid out of the chair.  He did not fall or lose consciousness.  This morning he again slid out of his chair.  His caretakers at group home noted that he was not his usual self and his responses were significantly delayed.  Of note, patient has chronic gait disturbance at baseline.  This morning when they checked his blood pressure at 1030 he noted that his blood pressure was 76/54 with pulse of 94.  They checked his blood pressure again at 1230 p.m. it was noted to be 81/51 with heart rate of 82.  EMS was called and given his low blood pressure he started him on a fluid bolus.  By the time patient presented to the emergency department his blood pressure had improved to 102/65.  However labs indicated that his creatinine was 2.17 which was elevated from his baseline of 1.2 in 2014.  Hospitalist service was asked to admit the patient for altered mental status, hypertension, and acute renal failure.  Patient's mentation is improved and is close to his baseline.  Review of Systems: All systems reviewed with the patient and positive as per history of present illness, otherwise all other systems are negative.  Past Medical History  Diagnosis Date  . Mental retardation   . Seizures   . Mood disorder    Past Surgical History  Procedure Laterality Date  . None     Family history and social history: Could not be obtained due to patient's altered mental status and  mental retardation at baseline. Family History  Problem Relation Age of Onset  . Family history unknown: Yes   History   Social History  . Marital Status: Single    Spouse Name: N/A    Number of Children: 0  . Years of Education: N/A   Occupational History  . Not on file.   Social History Main Topics  . Smoking status: Never Smoker   . Smokeless tobacco: Never Used  . Alcohol Use: No  . Drug Use: No  . Sexual Activity: Not on file   Other Topics Concern  . Not on file   Social History Narrative   ** Merged History Encounter **       Allergies: Review of patient's allergies indicates no known allergies.  Home Meds: Prior to Admission medications   Medication Sig Start Date End Date Taking? Authorizing Provider  acetaminophen (TYLENOL) 325 MG tablet Take 650 mg by mouth every 6 (six) hours as needed. For pain   Yes Historical Provider, MD  benztropine (COGENTIN) 0.5 MG tablet Take 0.5 mg by mouth 2 (two) times daily.   Yes Historical Provider, MD  famotidine (PEPCID) 20 MG tablet Take 20 mg by mouth daily.   Yes Historical Provider, MD  ketoconazole (NIZORAL) 2 % cream Apply 1 application topically 2 (two) times daily as needed. For ring worm rash   Yes Historical Provider, MD  levETIRAcetam (KEPPRA) 250 MG tablet Take 250 mg by mouth  2 (two) times daily.   Yes Historical Provider, MD  lithium carbonate 300 MG capsule Take 300 mg by mouth 2 (two) times daily with a meal.   Yes Historical Provider, MD  LORazepam (ATIVAN) 0.5 MG tablet Take 0.5 mg by mouth every morning.    Yes Historical Provider, MD  memantine (NAMENDA) 10 MG tablet Take 10 mg by mouth 2 (two) times daily.   Yes Historical Provider, MD  oxybutynin (DITROPAN-XL) 10 MG 24 hr tablet Take 10 mg by mouth daily.   Yes Historical Provider, MD  risperiDONE (RISPERDAL) 0.5 MG tablet Take 0.5 mg by mouth daily.   Yes Historical Provider, MD  risperiDONE (RISPERDAL) 2 MG tablet Take 2 mg by mouth at bedtime.   Yes  Historical Provider, MD    Physical Exam: Blood pressure 108/64, pulse 85, temperature 98.5 F (36.9 C), temperature source Oral, resp. rate 18, SpO2 97.00%. General: Awake, not oriented x3, No acute distress, responses were limited to one-word answers. HEENT: EOMI, Moist mucous membranes Neck: Supple CV: S1 and S2 Lungs: Clear to ascultation bilaterally Abdomen: Soft, Nontender, Nondistended, +bowel sounds. Ext: Good pulses. Trace edema. No clubbing or cyanosis noted. Neuro: Cranial Nerves could not be assessed due to compliance. Has 5/5 motor strength in upper and lower extremities.  Lab results:  Recent Labs  07/06/14 1634  NA 136*  K 3.6*  CL 103  CO2 18*  GLUCOSE 107*  BUN 41*  CREATININE 2.17*  CALCIUM 8.5    Recent Labs  07/06/14 1634  AST 23  ALT 15  ALKPHOS 85  BILITOT 0.3  PROT 6.3  ALBUMIN 3.1*    Recent Labs  07/06/14 1634  LIPASE 14    Recent Labs  07/06/14 1634  WBC 4.7  NEUTROABS 3.7  HGB 11.5*  HCT 34.4*  MCV 95.6  PLT 124*   No results found for this basename: CKTOTAL, CKMB, CKMBINDEX, TROPONINI,  in the last 72 hours No components found with this basename: POCBNP,  No results found for this basename: DDIMER,  in the last 72 hours No results found for this basename: HGBA1C,  in the last 72 hours No results found for this basename: CHOL, HDL, LDLCALC, TRIG, CHOLHDL, LDLDIRECT,  in the last 72 hours No results found for this basename: TSH, T4TOTAL, FREET3, T3FREE, THYROIDAB,  in the last 72 hours No results found for this basename: VITAMINB12, FOLATE, FERRITIN, TIBC, IRON, RETICCTPCT,  in the last 72 hours Imaging results:  Dg Chest 2 View  07/06/2014   CLINICAL DATA:  Acute mental status changes. Syncope. Fall today. Initial encounter.  EXAM: CHEST  2 VIEW  COMPARISON:  Portable chest x-ray 04/10/2014, 03/14/2013. Two-view chest x-ray 08/31/2009.  FINDINGS: Suboptimal inspiration accounts for crowded bronchovascular markings diffusely  and atelectasis in the bases, and accentuates the cardiac silhouette. Cardiac silhouette moderately to markedly enlarged, unchanged since 2014 but significantly increased in size since 2010. Thoracic aorta tortuous and atherosclerotic, unchanged. Pulmonary venous hypertension without overt edema. No visible pleural effusions. Osseous demineralization and exaggeration of the usual thoracic kyphosis.  IMPRESSION: Stable moderate to marked cardiomegaly without pulmonary edema. Suboptimal inspiration accounts for bibasilar atelectasis. No acute cardiopulmonary disease otherwise.   Electronically Signed   By: Hulan Saas M.D.   On: 07/06/2014 18:01   Dg Hip Bilateral W/pelvis  07/06/2014   CLINICAL DATA:  Acute mental status changes. Syncope. Fall today. Initial encounter.  EXAM: BILATERAL HIP WITH PELVIS - 4+ VIEW  COMPARISON:  AP pelvis 04/10/2014.  FINDINGS: No evidence of acute fracture or dislocation involving either hip. No fractures involving the pelvis. Sacroiliac joints and symphysis pubis intact with degenerative changes. Joint spaces in both hips well preserved for age. Visualized lower lumbar spine intact.  IMPRESSION: No acute osseous abnormality.   Electronically Signed   By: Hulan Saas M.D.   On: 07/06/2014 17:59   Ct Head Wo Contrast  07/06/2014   CLINICAL DATA:  Altered mental status ; near syncopal episode  EXAM: CT HEAD WITHOUT CONTRAST  CT CERVICAL SPINE WITHOUT CONTRAST  TECHNIQUE: Multidetector CT imaging of the head and cervical spine was performed following the standard protocol without intravenous contrast. Multiplanar CT image reconstructions of the cervical spine were also generated.  COMPARISON:  Noncontrast CT scan of the brain of April 10, 2014  FINDINGS: CT HEAD FINDINGS  The ventricles are normal in size and position. There is no intracranial hemorrhage nor intracranial mass effect. There is no acute ischemic change. The cerebellum and brainstem exhibit no acute  abnormalities.  There is persistent mucoperiosteal thickening within frontal, ethmoid, sphenoid, and maxillary sinuses. There are no air-fluid levels. The mastoid air cells are well pneumatized. There is no acute skull fracture. A stable lytic lesion in the left paramedian occipital bone is noted.  CT CERVICAL SPINE FINDINGS  The cervical vertebral bodies are preserved in height. There is stable fusion across the C6-7 disc. The prevertebral soft tissue spaces are normal. There is no perched facet. The odontoid is intact. The pulmonary apices are clear. The thyroid gland demonstrates heterogeneous density and subtle nodularity with one nodule on the left measuring as much as 1 cm in greatest dimension.  IMPRESSION: 1. There is no acute intracranial hemorrhage, acute ischemic change, nor other acute abnormality of the brain. 2. Stable pan sinus inflammation. 3. There is stable fusion across the C6-7 disc. There is no acute cervical spine fracture nor dislocation. 4. Nodularity of the thyroid gland. Elective follow-up thyroid ultrasound is recommended.   Electronically Signed   By: David  [place]   On: 07/06/2014 16:26   Ct Cervical Spine Wo Contrast  07/06/2014   CLINICAL DATA:  Altered mental status ; near syncopal episode  EXAM: CT HEAD WITHOUT CONTRAST  CT CERVICAL SPINE WITHOUT CONTRAST  TECHNIQUE: Multidetector CT imaging of the head and cervical spine was performed following the standard protocol without intravenous contrast. Multiplanar CT image reconstructions of the cervical spine were also generated.  COMPARISON:  Noncontrast CT scan of the brain of April 10, 2014  FINDINGS: CT HEAD FINDINGS  The ventricles are normal in size and position. There is no intracranial hemorrhage nor intracranial mass effect. There is no acute ischemic change. The cerebellum and brainstem exhibit no acute abnormalities.  There is persistent mucoperiosteal thickening within frontal, ethmoid, sphenoid, and maxillary sinuses. There  are no air-fluid levels. The mastoid air cells are well pneumatized. There is no acute skull fracture. A stable lytic lesion in the left paramedian occipital bone is noted.  CT CERVICAL SPINE FINDINGS  The cervical vertebral bodies are preserved in height. There is stable fusion across the C6-7 disc. The prevertebral soft tissue spaces are normal. There is no perched facet. The odontoid is intact. The pulmonary apices are clear. The thyroid gland demonstrates heterogeneous density and subtle nodularity with one nodule on the left measuring as much as 1 cm in greatest dimension.  IMPRESSION: 1. There is no acute intracranial hemorrhage, acute ischemic change, nor other acute abnormality of the brain. 2. Stable  pan sinus inflammation. 3. There is stable fusion across the C6-7 disc. There is no acute cervical spine fracture nor dislocation. 4. Nodularity of the thyroid gland. Elective follow-up thyroid ultrasound is recommended.   Electronically Signed   By: David  [place]   On: 07/06/2014 16:26   Other results: EKG: Sinus rhythm with PACs.  Assessment & Plan by Problem: Altered mental status/acute encephalopathy complicated by dementia Likely due to symptomatic hypotension from acute renal failure.  Urinalysis, chest x-ray, and head CT negative for any infectious or other etiology.  Appears to be at baseline.  Continue to monitor.  Continue medications for his dementia.  Hypotension/acute renal failure/dehydration Patient is on lithium, suspect patient due to his slow gait, may not have had access to water, which probably caused him to be dehydrated.  Caretaker indicated that patient has polydipsia and polyuria, likely from lithium.  Lithium level normal.  Continue hydration on IV fluids.  Reassess renal function and volume status daily.  If renal function does not improve with hydration consider further workup.  Near-syncope Due to above.  Monitor patient on telemetry.  Cycle cardiac enzymes.  If any  concerns for cardiac etiology, may consider 2-D echocardiogram.  Check orthostatic vitals.  Seizure disorder Continue home medications.  Fall/abnormal gait/generalized weakness Request physical therapy and occupational therapy at home.  Mood disorder/history of mental retardation Continue home medications.  Prophylaxis Subcutaneous heparin.  CODE STATUS Full code.  Disposition Admit the patient to telemetry as inpatient.  Time spent on admission, talking to the patient, and coordinating care was: 50 mins.  Robby Pirani A, MD 07/06/2014, 7:43 PM

## 2014-07-06 NOTE — ED Notes (Signed)
Per [facility] pt from group home in [place]; [facility]. Facility reports pt had a near syncopal yesterday and they allowed pt to sleep through out today. They report when they woke him up he was not himself, they report his normal behavior is "chatty." Pt originally Hypotensive of 80/60, pt received 700 ml NS by [facility]. Latest BP 125/60 after receiving fluids  18 G RH.

## 2014-07-07 DIAGNOSIS — F39 Unspecified mood [affective] disorder: Secondary | ICD-10-CM

## 2014-07-07 DIAGNOSIS — R269 Unspecified abnormalities of gait and mobility: Secondary | ICD-10-CM

## 2014-07-07 DIAGNOSIS — G9340 Encephalopathy, unspecified: Secondary | ICD-10-CM | POA: Diagnosis present

## 2014-07-07 DIAGNOSIS — N189 Chronic kidney disease, unspecified: Secondary | ICD-10-CM

## 2014-07-07 DIAGNOSIS — F79 Unspecified intellectual disabilities: Secondary | ICD-10-CM

## 2014-07-07 DIAGNOSIS — F0390 Unspecified dementia without behavioral disturbance: Secondary | ICD-10-CM

## 2014-07-07 DIAGNOSIS — R55 Syncope and collapse: Secondary | ICD-10-CM

## 2014-07-07 LAB — CBC
HCT: 34.1 % — ABNORMAL LOW (ref 39.0–52.0)
Hemoglobin: 11 g/dL — ABNORMAL LOW (ref 13.0–17.0)
MCH: 31.7 pg (ref 26.0–34.0)
MCHC: 32.3 g/dL (ref 30.0–36.0)
MCV: 98.3 fL (ref 78.0–100.0)
PLATELETS: 110 10*3/uL — AB (ref 150–400)
RBC: 3.47 MIL/uL — ABNORMAL LOW (ref 4.22–5.81)
RDW: 13.5 % (ref 11.5–15.5)
WBC: 4.2 10*3/uL (ref 4.0–10.5)

## 2014-07-07 LAB — BASIC METABOLIC PANEL
Anion gap: 12 (ref 5–15)
BUN: 37 mg/dL — AB (ref 6–23)
CO2: 19 mEq/L (ref 19–32)
CREATININE: 1.62 mg/dL — AB (ref 0.50–1.35)
Calcium: 8 mg/dL — ABNORMAL LOW (ref 8.4–10.5)
Chloride: 110 mEq/L (ref 96–112)
GFR, EST AFRICAN AMERICAN: 47 mL/min — AB (ref >90)
GFR, EST NON AFRICAN AMERICAN: 40 mL/min — AB (ref >90)
Glucose, Bld: 94 mg/dL (ref 70–99)
Potassium: 3.5 mEq/L — ABNORMAL LOW (ref 3.7–5.3)
Sodium: 141 mEq/L (ref 137–147)

## 2014-07-07 LAB — CLOSTRIDIUM DIFFICILE BY PCR: Toxigenic C. Difficile by PCR: NEGATIVE

## 2014-07-07 LAB — TROPONIN I
Troponin I: <0.3 ng/mL (ref <0.30)
Troponin I: <0.3 ng/mL (ref <0.30)

## 2014-07-07 NOTE — Progress Notes (Signed)
[facility] PROGRESS NOTE   Assessment/Plan: Acute encephalopathy/  Acute renal failure: - most likely pre renal. Renal function improved with IV fluids. - cont NS, check b-met in am. - lithium with in normal level.   Near syncope - no events on telemetry. - cardiac markers negative.  Dementia/ Mental retardation/ Mood disorder - Continue home medications.  Fall/abnormal gait/generalized weakness  - Request physical therapy and occupational therapy at home.    Code Status: full Family Communication: none  Disposition Plan: inpatient   Consultants:  none  Procedures:  none  Antibiotics:  None  HPI/Subjective: No compalins  Objective: Filed Vitals:   07/06/14 1835 07/06/14 2037 07/06/14 2100 07/07/14 0500  BP: 108/64 105/60 112/58 119/66  Pulse: 85  70 71  Temp:   97.6 F (36.4 C) 97.9 F (36.6 C)  TempSrc:      Resp: '18  18 18  ' Height:   '6\' 1"'  (1.854 m)   Weight:   95.301 kg (210 lb 1.6 oz) 95.754 kg (211 lb 1.6 oz)  SpO2: 97%  99% 99%    Intake/Output Summary (Last 24 hours) at 07/07/14 1210 Last data filed at 07/07/14 0934  Gross per 24 hour  Intake    120 ml  Output    776 ml  Net   -656 ml   Filed Weights   07/06/14 2100 07/07/14 0500  Weight: 95.301 kg (210 lb 1.6 oz) 95.754 kg (211 lb 1.6 oz)    Exam:  General:  in no acute distress.  HEENT: No bruits, no goiter.  Heart: Regular rate and rhythm. Lungs: Good air movement, clear Abdomen: Soft, nontender, nondistended, positive bowel sounds.   Data Reviewed: Basic Metabolic Panel:  Recent Labs Lab 07/06/14 1634 07/07/14 0335  NA 136* 141  K 3.6* 3.5*  CL 103 110  CO2 18* 19  GLUCOSE 107* 94  BUN 41* 37*  CREATININE 2.17* 1.62*  CALCIUM 8.5 8.0*   Liver Function Tests:  Recent Labs Lab 07/06/14 1634  AST 23  ALT 15  ALKPHOS 85  BILITOT 0.3  PROT 6.3  ALBUMIN 3.1*    Recent Labs Lab 07/06/14 1634  LIPASE 14   No results found for this basename:  AMMONIA,  in the last 168 hours CBC:  Recent Labs Lab 07/06/14 1634 07/07/14 0335  WBC 4.7 4.2  NEUTROABS 3.7  --   HGB 11.5* 11.0*  HCT 34.4* 34.1*  MCV 95.6 98.3  PLT 124* 110*   Cardiac Enzymes:  Recent Labs Lab 07/06/14 07/07/14 0335  TROPONINI <0.30 <0.30   BNP (last 3 results) No results found for this basename: PROBNP,  in the last 8760 hours CBG: No results found for this basename: GLUCAP,  in the last 168 hours  No results found for this or any previous visit (from the past 240 hour(s)).   Studies: Dg Chest 2 View  07/06/2014   CLINICAL DATA:  Acute mental status changes. Syncope. Fall today. Initial encounter.  EXAM: CHEST  2 VIEW  COMPARISON:  Portable chest x-ray 04/10/2014, 03/14/2013. Two-view chest x-ray 08/31/2009.  FINDINGS: Suboptimal inspiration accounts for crowded bronchovascular markings diffusely and atelectasis in the bases, and accentuates the cardiac silhouette. Cardiac silhouette moderately to markedly enlarged, unchanged since 2014 but significantly increased in size since 2010. Thoracic aorta tortuous and atherosclerotic, unchanged. Pulmonary venous hypertension without overt edema. No visible pleural effusions. Osseous demineralization and exaggeration of the usual thoracic kyphosis.  IMPRESSION: Stable moderate to marked cardiomegaly without pulmonary edema.  Suboptimal inspiration accounts for bibasilar atelectasis. No acute cardiopulmonary disease otherwise.   Electronically Signed   By: Evangeline Dakin M.D.   On: 07/06/2014 18:01   Dg Hip Bilateral W/pelvis  07/06/2014   CLINICAL DATA:  Acute mental status changes. Syncope. Fall today. Initial encounter.  EXAM: BILATERAL HIP WITH PELVIS - 4+ VIEW  COMPARISON:  AP pelvis 04/10/2014.  FINDINGS: No evidence of acute fracture or dislocation involving either hip. No fractures involving the pelvis. Sacroiliac joints and symphysis pubis intact with degenerative changes. Joint spaces in both hips well  preserved for age. Visualized lower lumbar spine intact.  IMPRESSION: No acute osseous abnormality.   Electronically Signed   By: Evangeline Dakin M.D.   On: 07/06/2014 17:59   Ct Head Wo Contrast  07/06/2014   CLINICAL DATA:  Altered mental status ; near syncopal episode  EXAM: CT HEAD WITHOUT CONTRAST  CT CERVICAL SPINE WITHOUT CONTRAST  TECHNIQUE: Multidetector CT imaging of the head and cervical spine was performed following the standard protocol without intravenous contrast. Multiplanar CT image reconstructions of the cervical spine were also generated.  COMPARISON:  Noncontrast CT scan of the brain of April 10, 2014  FINDINGS: CT HEAD FINDINGS  The ventricles are normal in size and position. There is no intracranial hemorrhage nor intracranial mass effect. There is no acute ischemic change. The cerebellum and brainstem exhibit no acute abnormalities.  There is persistent mucoperiosteal thickening within frontal, ethmoid, sphenoid, and maxillary sinuses. There are no air-fluid levels. The mastoid air cells are well pneumatized. There is no acute skull fracture. A stable lytic lesion in the left paramedian occipital bone is noted.  CT CERVICAL SPINE FINDINGS  The cervical vertebral bodies are preserved in height. There is stable fusion across the C6-7 disc. The prevertebral soft tissue spaces are normal. There is no perched facet. The odontoid is intact. The pulmonary apices are clear. The thyroid gland demonstrates heterogeneous density and subtle nodularity with one nodule on the left measuring as much as 1 cm in greatest dimension.  IMPRESSION: 1. There is no acute intracranial hemorrhage, acute ischemic change, nor other acute abnormality of the brain. 2. Stable pan sinus inflammation. 3. There is stable fusion across the C6-7 disc. There is no acute cervical spine fracture nor dislocation. 4. Nodularity of the thyroid gland. Elective follow-up thyroid ultrasound is recommended.   Electronically Signed    By: David  Martinique   On: 07/06/2014 16:26   Ct Cervical Spine Wo Contrast  07/06/2014   CLINICAL DATA:  Altered mental status ; near syncopal episode  EXAM: CT HEAD WITHOUT CONTRAST  CT CERVICAL SPINE WITHOUT CONTRAST  TECHNIQUE: Multidetector CT imaging of the head and cervical spine was performed following the standard protocol without intravenous contrast. Multiplanar CT image reconstructions of the cervical spine were also generated.  COMPARISON:  Noncontrast CT scan of the brain of April 10, 2014  FINDINGS: CT HEAD FINDINGS  The ventricles are normal in size and position. There is no intracranial hemorrhage nor intracranial mass effect. There is no acute ischemic change. The cerebellum and brainstem exhibit no acute abnormalities.  There is persistent mucoperiosteal thickening within frontal, ethmoid, sphenoid, and maxillary sinuses. There are no air-fluid levels. The mastoid air cells are well pneumatized. There is no acute skull fracture. A stable lytic lesion in the left paramedian occipital bone is noted.  CT CERVICAL SPINE FINDINGS  The cervical vertebral bodies are preserved in height. There is stable fusion across the C6-7 disc. The  prevertebral soft tissue spaces are normal. There is no perched facet. The odontoid is intact. The pulmonary apices are clear. The thyroid gland demonstrates heterogeneous density and subtle nodularity with one nodule on the left measuring as much as 1 cm in greatest dimension.  IMPRESSION: 1. There is no acute intracranial hemorrhage, acute ischemic change, nor other acute abnormality of the brain. 2. Stable pan sinus inflammation. 3. There is stable fusion across the C6-7 disc. There is no acute cervical spine fracture nor dislocation. 4. Nodularity of the thyroid gland. Elective follow-up thyroid ultrasound is recommended.   Electronically Signed   By: David  Martinique   On: 07/06/2014 16:26    Scheduled Meds: . benztropine  0.5 mg Oral BID  . famotidine  20 mg Oral  Daily  . heparin  5,000 Units Subcutaneous 3 times per day  . Influenza vac split quadrivalent PF  0.5 mL Intramuscular Tomorrow-1000  . levETIRAcetam  250 mg Oral BID  . lithium carbonate  300 mg Oral BID WC  . LORazepam  0.5 mg Oral BH-q7a  . memantine  10 mg Oral BID WC  . oxybutynin  10 mg Oral Daily  . risperiDONE  0.5 mg Oral Daily  . risperiDONE  2 mg Oral QHS  . sodium chloride  3 mL Intravenous Q12H   Continuous Infusions: . sodium chloride 100 mL/hr at 07/06/14 2344     Shawn Wilcox  [facility] Pager [phone]. If 8PM-8AM, please contact night-coverage at [email or web address], password [facility] 07/07/2014, 12:10 PM  LOS: 1 day

## 2014-07-07 NOTE — Progress Notes (Signed)
Utilization Review Completed.   Annarae Macnair, RN, BSN Nurse Case Manager  

## 2014-07-07 NOTE — Progress Notes (Signed)
PT Cancellation Note  Patient Details Name: Shawn Wilcox MRN: [id number] DOB: 12-26-1940   Cancelled Treatment:    Reason Eval/Treat Not Completed: Fatigue/lethargy limiting ability to participate;Patient's level of consciousness   Ivar Drape 07/07/2014, 10:45 AM Samul Dada, PT MS Acute Rehab Dept. Number: [phone]

## 2014-07-08 DIAGNOSIS — G9340 Encephalopathy, unspecified: Secondary | ICD-10-CM

## 2014-07-08 DIAGNOSIS — N179 Acute kidney failure, unspecified: Secondary | ICD-10-CM | POA: Diagnosis not present

## 2014-07-08 LAB — URINE CULTURE

## 2014-07-08 MED ORDER — LORAZEPAM 0.5 MG PO TABS: 0.5000 mg | ORAL_TABLET | ORAL | Status: AC

## 2014-07-08 NOTE — Progress Notes (Signed)
Occupational Therapy Evaluation Patient Details Name: Shawn Wilcox MRN: [id number] DOB: [birth year] Today's Date: 07/08/2014    History of Present Illness  73 y.o. male with history of mental retardation who lives in a group home, seizure disorder, and mood disorder who presents with AMD and hypotension. Hospitalist service was asked to admit the patient for altered mental status, hypertension, and acute renal failure.   Clinical Impression   PTA, pt lived in group home, where he was mod I with mobility and required assistance for self care. Pt has history of falls due to baseline abnormal, however, family states that gait has gotten progressively worse. At this time, pt requires +2 Max A for safe, functional ambulation and max A with self care. Feel pt would benefit from rehab at SNF to maximize functional level of independence and facilitate safe D/C back to group home. Will follow acutely.     Follow Up Recommendations  SNF;Supervision/Assistance - 24 hour    Equipment Recommendations  None recommended by OT    Recommendations for Other Services       Precautions / Restrictions Precautions Precautions: Fall      Mobility Bed Mobility    Pt up in chair              Transfers Overall transfer level: Needs assistance Equipment used: 2 person hand held assist Transfers: Sit to/from Stand;Stand Pivot Transfers Sit to Stand: +2 physical assistance;Max assist Stand pivot transfers: +2 physical assistance;Max assist       General transfer comment: Abnormal ataxic gait pattern at baseline, however, able to ambulate @ mod I level. Pt now requires +2 A to ambulate Heavy R bias; unaware of leaning to R    Balance      sitting - poor - R bias Standing - poor. R lat lean and posterior lean.                                       ADL Overall ADL's : Needs assistance/impaired Eating/Feeding: Set up       Upper Body Bathing: Moderate assistance   Lower  Body Bathing: Moderate assistance;Sit to/from stand   Upper Body Dressing : Moderate assistance   Lower Body Dressing: Maximal assistance;Sit to/from stand   Toilet Transfer: +2 for physical assistance;Maximal assistance   Toileting- Clothing Manipulation and Hygiene: +2 for physical assistance;Maximal assistance;Sit to/from stand Toileting - Clothing Manipulation Details (indicate cue type and reason): incontinent of BM     Functional mobility during ADLs: +2 for physical assistance;Maximal assistance General ADL Comments: functional decline per family.      Vision                     Perception     Praxis      Pertinent Vitals/Pain Pain Assessment: No/denies pain     Hand Dominance Right   Extremity/Trunk Assessment Upper Extremity Assessment Upper Extremity Assessment: Generalized weakness   Lower Extremity Assessment Lower Extremity Assessment: Defer to PT evaluation   Cervical / Trunk Assessment Cervical / Trunk Assessment: Kyphotic   Communication     Cognition Arousal/Alertness: Awake/alert Behavior During Therapy: Flat affect Overall Cognitive Status: History of cognitive impairments - at baseline (per family)                     General Comments       Exercises Exercises: Other exercises  Other Exercises Other Exercises: encourgaed general BUE AROM with pt/family   Shoulder Instructions      Home Living Family/patient expects to be discharged to:: Skilled nursing facility                                        Prior Functioning/Environment Level of Independence: Needs assistance  Gait / Transfers Assistance Needed: gait disorder at basline but mod I. frequent falls ADL's / Homemaking Assistance Needed: needs assistance. unsure of level, but sister in law states she thinks pt assists with self care. independent with feeding self        OT Diagnosis: Generalized weakness;Cognitive deficits   OT Problem List:  Decreased strength;Decreased activity tolerance;Impaired balance (sitting and/or standing)   OT Treatment/Interventions: Self-care/ADL training;Therapeutic exercise;Therapeutic activities;DME and/or AE instruction;Patient/family education;Balance training    OT Goals(Current goals can be found in the care plan section) Acute Rehab OT Goals Patient Stated Goal: none stated by pt. family states to "get around better and not fall" OT Goal Formulation: With patient/family Time For Goal Achievement: 07/22/14 Potential to Achieve Goals: Good  OT Frequency: Min 2X/week   Barriers to D/C:            Co-evaluation  [place] with PT for pt/staff safety OT focus on ADL and strengthening/balance            End of Session Equipment Utilized During Treatment: Gait belt Nurse Communication: Mobility status  Activity Tolerance: Patient tolerated treatment well Patient left: in chair;with call bell/phone within reach;with chair alarm set;with family/visitor present   Time: 1030-1046 OT Time Calculation (min): 16 min Charges:  OT General Charges $OT Visit: 1 Procedure OT Evaluation $Initial OT Evaluation Tier I: 1 Procedure OT Treatments $Self Care/Home Management : 8-22 mins G-Codes:    Lezlee Gills,HILLARY 29-Jul-2014, 12:13 PM   [facility], OTR/L  [phone] July 29, 2014

## 2014-07-08 NOTE — Progress Notes (Addendum)
CSW (Clinical Child psychotherapist) aware of PT recommendation. CSW has spoken with family and group home and they believe SNF is best option. CSW has submitted for [facility]. Full assessment to follow. MD notified.  Armari Fussell, LCSWA [phone]

## 2014-07-08 NOTE — Plan of Care (Signed)
Problem: Phase III Progression Outcomes Goal: Tolerating diet Outcome: Progressing Patient is pocketing food inside mouth. Needs to be monitored and prompted to swallow after food and medications.

## 2014-07-08 NOTE — Evaluation (Signed)
Physical Therapy Evaluation Patient Details Name: Shawn Wilcox MRN: [id number] DOB: 06-Dec-1940 Today's Date: 07/08/2014   History of Present Illness   73 y.o. male with history of mental retardation who lives in a group home, seizure disorder, and mood disorder who presents with AMD and hypotension. Hospitalist service was asked to admit the patient for altered mental status, hypertension, and acute renal failure.  Clinical Impression  Pt admitted with generalized weakness due to ARF. Pt currently with functional limitations due to the deficits listed below (see PT Problem List). Pt requiring +2 assist for safe mobility at this point. Pt will benefit from skilled PT to increase their independence and safety with mobility to allow discharge to the venue listed below. PT will continue to follow.      Follow Up Recommendations SNF;Supervision/Assistance - 24 hour    Equipment Recommendations  None recommended by PT    Recommendations for Other Services       Precautions / Restrictions Precautions Precautions: Fall Precaution Comments: pt's sister-in-law relays that pt was falling at group home before coming in Restrictions Weight Bearing Restrictions: No      Mobility  Bed Mobility Overal bed mobility: Needs Assistance Bed Mobility: Supine to Sit     Supine to sit: Mod assist     General bed mobility comments: mod A needed to get legs off bed and initiate trunk elevation. Pt followed commands to grab rail and pull self to left  Transfers Overall transfer level: Needs assistance Equipment used: 2 person hand held assist Transfers: Sit to/from Stand Sit to Stand: +2 physical assistance;Max assist Stand pivot transfers: +2 physical assistance;Max assist       General transfer comment: heavy right lean and at times posterior. intermittent partial buckling of knees  Ambulation/Gait Ambulation/Gait assistance: +2 physical assistance;Mod assist Ambulation Distance (Feet): 8  Feet Assistive device: 2 person hand held assist Gait Pattern/deviations: Ataxic;Staggering right;Decreased stride length;Narrow base of support Gait velocity: decreased   General Gait Details: heavy right lean, vc's for upright posture during gait and vc's for picking up feet. Requires +2 assist for safe ambulation at this point.  Stairs            Wheelchair Mobility    Modified Rankin (Stroke Patients Only)       Balance Overall balance assessment: Needs assistance Sitting-balance support: No upper extremity supported;Feet supported Sitting balance-Leahy Scale: Fair     Standing balance support: Bilateral upper extremity supported;During functional activity Standing balance-Leahy Scale: Poor Standing balance comment: pt unable to stand with bilateral hands on sink, requiring strong support on right side even with bilateral UE supported                             Pertinent Vitals/Pain Pain Assessment: No/denies pain    Home Living Family/patient expects to be discharged to:: Skilled nursing facility                 Additional Comments: unsure of how much assistance able to be provided at group home but at this time, pt needing +2 assist for safety with mobility our of bed and would benefit from SNF with rehab    Prior Function Level of Independence: Needs assistance   Gait / Transfers Assistance Needed: gait disorder at basline but family member reports that she ambulated with him arm in arm. frequent falls. She also reports that he had difficulty with upper body getting ahead of lower body  with increased forward lean and occasional crossing over of legs.  ADL's / Homemaking Assistance Needed: needs assistance. unsure of level, but sister in law states she thinks pt assists with self care. independent with feeding self  Comments: family says it has been harder recently to get pt in and out of car to take him out to dinner. He is resistance to  equipment, they have tried RW and cane but he doesn't like it and she says it has seemed to make his gait worse because it confuses him     Hand Dominance   Dominant Hand: Right    Extremity/Trunk Assessment   Upper Extremity Assessment: Defer to OT evaluation           Lower Extremity Assessment: RLE deficits/detail;LLE deficits/detail;Generalized weakness RLE Deficits / Details: grossly 3-/5 throughout, heavy right lean with ambulation, sister in law reports that lean is not consistently to right, sometimes left LLE Deficits / Details: grossly 3-/5 throughout  Cervical / Trunk Assessment: Kyphotic  Communication   Communication: Expressive difficulties (decreased verbalization, retardation)  Cognition Arousal/Alertness: Awake/alert Behavior During Therapy: Flat affect Overall Cognitive Status: History of cognitive impairments - at baseline       Memory: Decreased short-term memory              General Comments      Exercises General Exercises - Lower Extremity Ankle Circles/Pumps: AROM;Both;20 reps;Seated Other Exercises Other Exercises: encourgaed general BUE AROM with pt/family      Assessment/Plan    PT Assessment Patient needs continued PT services  PT Diagnosis Difficulty walking;Abnormality of gait;Generalized weakness;Altered mental status   PT Problem List Decreased strength;Decreased activity tolerance;Decreased balance;Decreased mobility;Decreased coordination;Decreased knowledge of use of DME;Decreased cognition;Decreased safety awareness;Decreased knowledge of precautions  PT Treatment Interventions DME instruction;Gait training;Functional mobility training;Therapeutic activities;Therapeutic exercise;Balance training;Cognitive remediation;Patient/family education   PT Goals (Current goals can be found in the Care Plan section) Acute Rehab PT Goals Patient Stated Goal: none stated by pt. family states to "get around better and not fall" PT Goal  Formulation: With patient Time For Goal Achievement: 07/22/14 Potential to Achieve Goals: Good    Frequency Min 2X/week   Barriers to discharge Decreased caregiver support unsure whether pt has adequte support at group home, most safe environment at this point would be SNF with therapy    Co-evaluation PT/OT/SLP Co-Evaluation/Treatment: Yes Reason for Co-Treatment: Necessary to address cognition/behavior during functional activity;For patient/therapist safety PT goals addressed during session: Mobility/safety with mobility;Balance         End of Session Equipment Utilized During Treatment: Gait belt Activity Tolerance: Patient tolerated treatment well Patient left: in chair;with chair alarm set;with call bell/phone within reach;with family/visitor present Nurse Communication: Mobility status         Time: 1105-1141 PT Time Calculation (min): 36 min   Charges:   PT Evaluation $Initial PT Evaluation Tier I: 1 Procedure PT Treatments $Gait Training: 8-22 mins   PT G Codes:        Lyanne Co, PT  Acute Rehab Services  [phone]   Lyanne Co 07/08/2014, 12:43 PM

## 2014-07-08 NOTE — Progress Notes (Addendum)
Patient noted to have frequent loose brown/yellow liquid stools. Foul smelling. Cdiff x2 negative. Benedetto Coons notified. New orders placed. Placed on enteric precautions. Gilman Schmidt

## 2014-07-08 NOTE — Discharge Summary (Addendum)
Physician Discharge Summary  Shawn Wilcox MBW:[id number] DOB: Feb 20, 1941 DOA: 07/06/2014  PCP: Gerrit Heck, MD  Admit date: 07/06/2014 Discharge date: 07/08/2014  Time spent: 35 minutes  Recommendations for Outpatient Follow-up:  1. Follow up with PCP at facility. Check a b-met and lithium level in 1 week.  Discharge Diagnoses:  Principal Problem:   Acute encephalopathy Active Problems:   Primary generalized seizure disorder   Abnormality of gait   Mental retardation   Mood disorder   Acute renal failure   Generalized weakness   Near syncope   Dementia   Discharge Condition: stable  Diet recommendation: Dys 3  Filed Weights   07/06/14 2100 07/07/14 0500 07/08/14 0500  Weight: 95.301 kg (210 lb 1.6 oz) 95.754 kg (211 lb 1.6 oz) 93.441 kg (206 lb)    History of present illness:  73 y.o. Caucasian male with history of mental retardation is in a group home, seizure disorder, and mood disorder who presents with the above complaints. Patient cannot provide any history at this time. Most of the history was obtained from caretaker at bedside. Patient over the last week has been noted to be more sluggish and tired with delayed responses. Yesterday evening he was sitting and he slid out of the chair. He did not fall or lose consciousness. This morning he again slid out of his chair. His caretakers at group home noted that he was not his usual self and his responses were significantly delayed. Of note, patient has chronic gait disturbance at baseline. This morning when they checked his blood pressure at 1030 he noted that his blood pressure was 76/54 with pulse of 94. They checked his blood pressure again at 1230 p.m. it was noted to be 81/51 with heart rate of 82. EMS was called and given his low blood pressure he started him on a fluid bolus. By the time patient presented to the emergency department his blood pressure had improved to 102/65   Hospital Course:  Acute  encephalopathy/ Acute renal failure:  - most likely pre renal. Renal function improved with IV fluids.  - Creatinine back to baseline 1.6. - lithium with in normal level.   Near syncope  - no events on telemetry.  - cardiac markers negative.   Dementia/ Mental retardation/ Mood disorder  - Continue home medications.   Fall/abnormal gait/generalized weakness  - Request physical therapy and occupational therapy at home.  Procedures: CXR  Consultations:  none  Discharge Exam: Filed Vitals:   07/08/14 0827  BP: 100/76  Pulse: 76  Temp: 97.7 F (36.5 C)  Resp: 16    General: A&O x1 Cardiovascular: RRR Respiratory: good air movement CTA B/L  Discharge Instructions You were cared for by a hospitalist during your hospital stay. If you have any questions about your discharge medications or the care you received while you were in the hospital after you are discharged, you can call the unit and asked to speak with the hospitalist on call if the hospitalist that took care of you is not available. Once you are discharged, your primary care physician will handle any further medical issues. Please note that NO REFILLS for any discharge medications will be authorized once you are discharged, as it is imperative that you return to your primary care physician (or establish a relationship with a primary care physician if you do not have one) for your aftercare needs so that they can reassess your need for medications and monitor your lab values.  Discharge Instructions  Diet - low sodium heart healthy    Complete by:  As directed      Increase activity slowly    Complete by:  As directed           Current Discharge Medication List    CONTINUE these medications which have CHANGED   Details  LORazepam (ATIVAN) 0.5 MG tablet Take 1 tablet (0.5 mg total) by mouth every morning. Qty: 30 tablet, Refills: 0      CONTINUE these medications which have NOT CHANGED   Details   acetaminophen (TYLENOL) 325 MG tablet Take 650 mg by mouth every 6 (six) hours as needed. For pain    benztropine (COGENTIN) 0.5 MG tablet Take 0.5 mg by mouth 2 (two) times daily.    famotidine (PEPCID) 20 MG tablet Take 20 mg by mouth daily.    ketoconazole (NIZORAL) 2 % cream Apply 1 application topically 2 (two) times daily as needed. For ring worm rash    levETIRAcetam (KEPPRA) 250 MG tablet Take 250 mg by mouth 2 (two) times daily.    lithium carbonate 300 MG capsule Take 300 mg by mouth 2 (two) times daily with a meal.    memantine (NAMENDA) 10 MG tablet Take 10 mg by mouth 2 (two) times daily.    oxybutynin (DITROPAN-XL) 10 MG 24 hr tablet Take 10 mg by mouth daily.    !! risperiDONE (RISPERDAL) 0.5 MG tablet Take 0.5 mg by mouth daily.    !! risperiDONE (RISPERDAL) 2 MG tablet Take 2 mg by mouth at bedtime.     !! - Potential duplicate medications found. Please discuss with provider.     No Known Allergies    The results of significant diagnostics from this hospitalization (including imaging, microbiology, ancillary and laboratory) are listed below for reference.    Significant Diagnostic Studies: Dg Chest 2 View  07/06/2014   CLINICAL DATA:  Acute mental status changes. Syncope. Fall today. Initial encounter.  EXAM: CHEST  2 VIEW  COMPARISON:  Portable chest x-ray 04/10/2014, 03/14/2013. Two-view chest x-ray 08/31/2009.  FINDINGS: Suboptimal inspiration accounts for crowded bronchovascular markings diffusely and atelectasis in the bases, and accentuates the cardiac silhouette. Cardiac silhouette moderately to markedly enlarged, unchanged since 2014 but significantly increased in size since 2010. Thoracic aorta tortuous and atherosclerotic, unchanged. Pulmonary venous hypertension without overt edema. No visible pleural effusions. Osseous demineralization and exaggeration of the usual thoracic kyphosis.  IMPRESSION: Stable moderate to marked cardiomegaly without pulmonary  edema. Suboptimal inspiration accounts for bibasilar atelectasis. No acute cardiopulmonary disease otherwise.   Electronically Signed   By: Evangeline Dakin M.D.   On: 07/06/2014 18:01   Dg Hip Bilateral W/pelvis  07/06/2014   CLINICAL DATA:  Acute mental status changes. Syncope. Fall today. Initial encounter.  EXAM: BILATERAL HIP WITH PELVIS - 4+ VIEW  COMPARISON:  AP pelvis 04/10/2014.  FINDINGS: No evidence of acute fracture or dislocation involving either hip. No fractures involving the pelvis. Sacroiliac joints and symphysis pubis intact with degenerative changes. Joint spaces in both hips well preserved for age. Visualized lower lumbar spine intact.  IMPRESSION: No acute osseous abnormality.   Electronically Signed   By: Evangeline Dakin M.D.   On: 07/06/2014 17:59   Ct Head Wo Contrast  07/06/2014   CLINICAL DATA:  Altered mental status ; near syncopal episode  EXAM: CT HEAD WITHOUT CONTRAST  CT CERVICAL SPINE WITHOUT CONTRAST  TECHNIQUE: Multidetector CT imaging of the head and cervical spine was performed following the standard protocol  without intravenous contrast. Multiplanar CT image reconstructions of the cervical spine were also generated.  COMPARISON:  Noncontrast CT scan of the brain of April 10, 2014  FINDINGS: CT HEAD FINDINGS  The ventricles are normal in size and position. There is no intracranial hemorrhage nor intracranial mass effect. There is no acute ischemic change. The cerebellum and brainstem exhibit no acute abnormalities.  There is persistent mucoperiosteal thickening within frontal, ethmoid, sphenoid, and maxillary sinuses. There are no air-fluid levels. The mastoid air cells are well pneumatized. There is no acute skull fracture. A stable lytic lesion in the left paramedian occipital bone is noted.  CT CERVICAL SPINE FINDINGS  The cervical vertebral bodies are preserved in height. There is stable fusion across the C6-7 disc. The prevertebral soft tissue spaces are normal. There  is no perched facet. The odontoid is intact. The pulmonary apices are clear. The thyroid gland demonstrates heterogeneous density and subtle nodularity with one nodule on the left measuring as much as 1 cm in greatest dimension.  IMPRESSION: 1. There is no acute intracranial hemorrhage, acute ischemic change, nor other acute abnormality of the brain. 2. Stable pan sinus inflammation. 3. There is stable fusion across the C6-7 disc. There is no acute cervical spine fracture nor dislocation. 4. Nodularity of the thyroid gland. Elective follow-up thyroid ultrasound is recommended.   Electronically Signed   By: David  Martinique   On: 07/06/2014 16:26   Ct Cervical Spine Wo Contrast  07/06/2014   CLINICAL DATA:  Altered mental status ; near syncopal episode  EXAM: CT HEAD WITHOUT CONTRAST  CT CERVICAL SPINE WITHOUT CONTRAST  TECHNIQUE: Multidetector CT imaging of the head and cervical spine was performed following the standard protocol without intravenous contrast. Multiplanar CT image reconstructions of the cervical spine were also generated.  COMPARISON:  Noncontrast CT scan of the brain of April 10, 2014  FINDINGS: CT HEAD FINDINGS  The ventricles are normal in size and position. There is no intracranial hemorrhage nor intracranial mass effect. There is no acute ischemic change. The cerebellum and brainstem exhibit no acute abnormalities.  There is persistent mucoperiosteal thickening within frontal, ethmoid, sphenoid, and maxillary sinuses. There are no air-fluid levels. The mastoid air cells are well pneumatized. There is no acute skull fracture. A stable lytic lesion in the left paramedian occipital bone is noted.  CT CERVICAL SPINE FINDINGS  The cervical vertebral bodies are preserved in height. There is stable fusion across the C6-7 disc. The prevertebral soft tissue spaces are normal. There is no perched facet. The odontoid is intact. The pulmonary apices are clear. The thyroid gland demonstrates heterogeneous  density and subtle nodularity with one nodule on the left measuring as much as 1 cm in greatest dimension.  IMPRESSION: 1. There is no acute intracranial hemorrhage, acute ischemic change, nor other acute abnormality of the brain. 2. Stable pan sinus inflammation. 3. There is stable fusion across the C6-7 disc. There is no acute cervical spine fracture nor dislocation. 4. Nodularity of the thyroid gland. Elective follow-up thyroid ultrasound is recommended.   Electronically Signed   By: David  Martinique   On: 07/06/2014 16:26    Microbiology: Recent Results (from the past 240 hour(s))  CULTURE, BLOOD (ROUTINE X 2)     Status: None   Collection Time    07/06/14  4:34 PM      Result Value Ref Range Status   Specimen Description BLOOD LEFT ARM   Final   Special Requests BOTTLES DRAWN AEROBIC AND  ANAEROBIC 10 CC   Final   Culture  Setup Time     Final   Value: 07/06/2014 21:52     Performed at [facility]   Culture     Final   Value:        BLOOD CULTURE RECEIVED NO GROWTH TO DATE CULTURE WILL BE HELD FOR 5 DAYS BEFORE ISSUING A FINAL NEGATIVE REPORT     Performed at [facility]   Report Status PENDING   Incomplete  CULTURE, BLOOD (ROUTINE X 2)     Status: None   Collection Time    07/06/14  4:38 PM      Result Value Ref Range Status   Specimen Description BLOOD LEFT WRIST   Final   Special Requests BOTTLES DRAWN AEROBIC ONLY 1 CC   Final   Culture  Setup Time     Final   Value: 07/06/2014 21:51     Performed at [facility]   Culture     Final   Value: STREPTOCOCCUS SPECIES     Note: Gram Stain Report Called to,Read Back By and Verified With: CESAR KALOMBO 07/07/14 @ 4:21PM BY RUSCOE A.     Performed at [facility]   Report Status PENDING   Incomplete  CLOSTRIDIUM DIFFICILE BY PCR     Status: None   Collection Time    07/07/14  3:35 PM      Result Value Ref Range Status   C difficile by pcr NEGATIVE  NEGATIVE Final     Labs: Basic Metabolic  Panel:  Recent Labs Lab 07/06/14 1634 07/07/14 0335  NA 136* 141  K 3.6* 3.5*  CL 103 110  CO2 18* 19  GLUCOSE 107* 94  BUN 41* 37*  CREATININE 2.17* 1.62*  CALCIUM 8.5 8.0*   Liver Function Tests:  Recent Labs Lab 07/06/14 1634  AST 23  ALT 15  ALKPHOS 85  BILITOT 0.3  PROT 6.3  ALBUMIN 3.1*    Recent Labs Lab 07/06/14 1634  LIPASE 14   No results found for this basename: AMMONIA,  in the last 168 hours CBC:  Recent Labs Lab 07/06/14 1634 07/07/14 0335  WBC 4.7 4.2  NEUTROABS 3.7  --   HGB 11.5* 11.0*  HCT 34.4* 34.1*  MCV 95.6 98.3  PLT 124* 110*   Cardiac Enzymes:  Recent Labs Lab 07/06/14 07/07/14 0335  TROPONINI <0.30 <0.30   BNP: BNP (last 3 results) No results found for this basename: PROBNP,  in the last 8760 hours CBG: No results found for this basename: GLUCAP,  in the last 168 hours     Signed:  Charlynne Cousins  [facility] 07/08/2014, 11:20 AM

## 2014-07-08 NOTE — Evaluation (Signed)
Clinical/Bedside Swallow Evaluation Patient Details  Name: KURTIS ANASTASIA MRN: [id number] Date of Birth: 1941-06-10  Today's Date: 07/08/2014 Time: 1520-1550 SLP Time Calculation (min): 30 min  Past Medical History:  Past Medical History  Diagnosis Date  . Mental retardation   . Seizures   . Mood disorder    Past Surgical History:  Past Surgical History  Procedure Laterality Date  . None     HPI:  73 year old male admitted 07/06/14 due to AMS. PMH significant for mental retardation, seizure disorder. CXR revealed no acute acrdiopulmonary disease. RN reported pocketing with meals.   Assessment / Plan / Recommendation Clinical Impression  Oral care completed. SLP noted what appeared to be dried blood on oral swab. Pt unable to verbalize what he had eaten for lunch. RN notified. Pt given multiple consistencies. No overt s/s aspiration observed with any consistency. Alternating solids and liquids appeared to minimize oral residue. Soft/chopped diet may be beneficial due to poor dentition. Will downgrade diet to dys 3 with chopped meats, thin liquids for this reason.    Aspiration Risk  Mild    Diet Recommendation Dysphagia 3 (Mechanical Soft);Thin liquid (chop meats)   Liquid Administration via: Cup;Straw Medication Administration: Whole meds with liquid Supervision: Staff to assist with self feeding;Patient able to self feed Compensations: Slow rate;Small sips/bites;Follow solids with liquid Postural Changes and/or Swallow Maneuvers: Seated upright 90 degrees;Upright 30-60 min after meal    Other  Recommendations Oral Care Recommendations: Oral care BID Other Recommendations: Clarify dietary restrictions   Follow Up Recommendations  None    Frequency and Duration        Pertinent Vitals/Pain VSS, no pain reported    SLP Swallow Goals  n/a   Swallow Study Prior Functional Status    no history of swallowing difficulty found.    General Date of Onset: 07/06/14 HPI: 73  year old male admitted 07/06/14 due to AMS. PMH significant for mental retardation, seizure disorder. CXR revealed no acute acrdiopulmonary disease. RN reported pocketing with meals. Type of Study: Bedside swallow evaluation Previous Swallow Assessment: none Diet Prior to this Study: Regular;Thin liquids Temperature Spikes Noted: No Respiratory Status: Room air History of Recent Intubation: No Behavior/Cognition: Alert;Cooperative;Requires cueing Oral Cavity - Dentition: Missing dentition;Poor condition Self-Feeding Abilities: Needs assist;Able to feed self Patient Positioning: Upright in chair Baseline Vocal Quality: Clear Volitional Cough: Cognitively unable to elicit Volitional Swallow: Unable to elicit    Oral/Motor/Sensory Function Overall Oral Motor/Sensory Function: Appears within functional limits for tasks assessed   Ice Chips Ice chips: Within functional limits Presentation: Spoon   Thin Liquid Thin Liquid: Within functional limits Presentation: Straw    Nectar Thick Nectar Thick Liquid: Not tested   Honey Thick Honey Thick Liquid: Not tested   Puree Puree: Within functional limits Presentation: Spoon   Solid   GO   Celia B. Bueche, MSP, CCC-SLP [phone] Solid: Within functional limits       Leigh Aurora 07/08/2014,3:51 PM

## 2014-07-09 LAB — CULTURE, BLOOD (ROUTINE X 2)

## 2014-07-09 NOTE — Progress Notes (Signed)
Clinical Social Work Department BRIEF PSYCHOSOCIAL ASSESSMENT 07/09/2014  Patient:  Shawn Wilcox, Shawn Wilcox     Account Number:  [id number]     Admit date:  07/06/2014  Clinical Social Worker:  Harless Nakayama  Date/Time:  07/08/2014 03:00 PM  Referred by:  Physician  Date Referred:  07/08/2014 Referred for  SNF Placement   Other Referral:   Interview type:  Family Other interview type:   Spoke with pt brother and group home QP    PSYCHOSOCIAL DATA Living Status:  FACILITY Admitted from facility:  Other Level of care:  Group Home Primary support name:  [name] Primary support relationship to patient:  SIBLING Degree of support available:   Pt has good support system    CURRENT CONCERNS Current Concerns  Post-Acute Placement   Other Concerns:    SOCIAL WORK ASSESSMENT / PLAN CSW made aware that pt is ready for dc. Pt is from Group home but PT recommendation is for SNF. CSW spoke with pt brother [name] and explained recommendation. [name] informed CSW he is agreeable to whatever plan the group home is agreeable to. Per [name], the group home was providing a high level of care for the pt but he was aware that they felt it may have been becoming too high level of care. [name] provided CSW with group home name, [facility], and QP [name] contact information [phone]). CSW spoke with Bridgette and assessed pt baseline at facility. Bridgette confirmed what [name] had explained to CSW. CSW explained that CSW could assist with finding placement if needed, but pt is ready for dc and pt PASARR will likely go to a level 2 review. CSW inquired if pt can return to group home and have level 2 evaluation and home health social work assist with placement from group home. Bridgette informed CSW she would check with administration and let CSW know.    Bridgette returned CSW call and informed CSW that the facility does not feel comfortable having pt dc back with current PT recommendation.  CSW informed that CSW would begin looking for placement. CSW called pt brother [name] again and notified of plan. [name] informed CSW that he is from [place] and CSW should coordinate choosing facility with pt other brother [name] who lives here in [place].   Assessment/plan status:  Psychosocial Support/Ongoing Assessment of Needs Other assessment/ plan:   Information/referral to community resources:   SNF list to be provided with bed offers    PATIENT'S/FAMILY'S RESPONSE TO PLAN OF CARE: Pt family and facility agreeable to pt discharging to SNF       [place], LCSWA [phone]

## 2014-07-09 NOTE — Progress Notes (Addendum)
Clinical Social Work Department CLINICAL SOCIAL WORK PLACEMENT NOTE 07/09/2014  Patient:  Shawn Wilcox, Shawn Wilcox  Account Number:  [id number] Admit date:  07/06/2014  Clinical Social Worker:  Harless Nakayama  Date/time:  07/09/2014 09:20 AM  Clinical Social Work is seeking post-discharge placement for this patient at the following level of care:   SKILLED NURSING   (*CSW will update this form in Epic as items are completed)   07/09/2014  Patient/family provided with [facility] Department of Clinical Social Work's list of facilities offering this level of care within the geographic area requested by the patient (or if unable, by the patient's family).  07/09/2014  Patient/family informed of their freedom to choose among providers that offer the needed level of care, that participate in Medicare, Medicaid or managed care program needed by the patient, have an available bed and are willing to accept the patient.  07/09/2014  Patient/family informed of [facility]' ownership interest in [facility], as well as of the fact that they are under no obligation to receive care at this facility.  PASARR submitted to EDS on 07/09/2014 PASARR number received on   FL2 transmitted to all facilities in geographic area requested by pt/family on  07/09/2014 FL2 transmitted to all facilities within larger geographic area on 07/09/2014  Patient informed that his/her managed care company has contracts with or will negotiate with  certain facilities, including the following:     Patient/family informed of bed offers received:  07/11/2014 Patient chooses bed at [facility] Physician recommends and patient chooses bed at    [facility]  07/11/2014 Patient to be transferred to facility by [facility] Patient and family notified of transfer on 07/11/2014 Name of family member notified:  Annamaria Helling  The following physician request were entered in Epic: Physician Request   Please sign FL2.    Additional CommentsSharol Harness, LCSWA [phone]

## 2014-07-10 MED ORDER — POTASSIUM CHLORIDE CRYS ER 20 MEQ PO TBCR
40.0000 meq | EXTENDED_RELEASE_TABLET | Freq: Once | ORAL | Status: DC
  Filled 2014-07-10 (×3): qty 2

## 2014-07-10 MED ORDER — POTASSIUM CHLORIDE CRYS ER 20 MEQ PO TBCR: 40.0000 meq | EXTENDED_RELEASE_TABLET | Freq: Two times a day (BID) | ORAL | Status: DC

## 2014-07-10 NOTE — Progress Notes (Signed)
[facility] PROGRESS NOTE  Assessment/Plan: Acute encephalopathy/ Acute renal failure - Resolved. - Awaiting placement.   Consultants:  none  Procedures:  Ct cervical  Ct head  Hip x-ray  Antibiotics:  None  HPI/Subjective: No compalins  Objective: Filed Vitals:   07/09/14 1345 07/09/14 2112 07/10/14 0614 07/10/14 0940  BP: 115/74 108/56 124/65 109/67  Pulse: 107 99 89 97  Temp: 98.8 F (37.1 C) 99.3 F (37.4 C) 98.8 F (37.1 C) 98 F (36.7 C)  TempSrc: Oral Oral Oral Oral  Resp: Height:      Weight:      SpO2: 92% 95% 95% 94%    Intake/Output Summary (Last 24 hours) at 07/10/14 1109 Last data filed at 07/10/14 1037  Gross per 24 hour  Intake    720 ml  Output   2652 ml  Net  -1932 ml   Filed Weights   07/07/14 0500 07/08/14 0500 07/09/14 0500  Weight: 95.754 kg (211 lb 1.6 oz) 93.441 kg (206 lb) 96.616 kg (213 lb)    Exam:  General: Alert, awake, oriented x3. HEENT: No bruits, no goiter.  Heart: Regular rate and rhythm. Lungs: Good air movement, clear Abdomen: Soft, nontender, nondistended, positive bowel sounds.    Data Reviewed: Basic Metabolic Panel:  Recent Labs Lab 07/06/14 1634 07/07/14 0335  NA 136* 141  K 3.6* 3.5*  CL 103 110  CO2 18* 19  GLUCOSE 107* 94  BUN 41* 37*  CREATININE 2.17* 1.62*  CALCIUM 8.5 8.0*   Liver Function Tests:  Recent Labs Lab 07/06/14 1634  AST 23  ALT 15  ALKPHOS 85  BILITOT 0.3  PROT 6.3  ALBUMIN 3.1*    Recent Labs Lab 07/06/14 1634  LIPASE 14   No results found for this basename: AMMONIA,  in the last 168 hours CBC:  Recent Labs Lab 07/06/14 1634 07/07/14 0335  WBC 4.7 4.2  NEUTROABS 3.7  --   HGB 11.5* 11.0*  HCT 34.4* 34.1*  MCV 95.6 98.3  PLT 124* 110*   Cardiac Enzymes:  Recent Labs Lab 07/06/14 07/07/14 0335  TROPONINI <0.30 <0.30   BNP (last 3 results) No results found for this basename: PROBNP,  in the last 8760 hours CBG: No  results found for this basename: GLUCAP,  in the last 168 hours  Recent Results (from the past 240 hour(s))  CULTURE, BLOOD (ROUTINE X 2)     Status: None   Collection Time    07/06/14  4:34 PM      Result Value Ref Range Status   Specimen Description BLOOD LEFT ARM   Final   Special Requests BOTTLES DRAWN AEROBIC AND ANAEROBIC 10 CC   Final   Culture  Setup Time     Final   Value: 07/06/2014 21:52     Performed at [facility]   Culture     Final   Value:        BLOOD CULTURE RECEIVED NO GROWTH TO DATE CULTURE WILL BE HELD FOR 5 DAYS BEFORE ISSUING A FINAL NEGATIVE REPORT     Performed at [facility]   Report Status PENDING   Incomplete  CULTURE, BLOOD (ROUTINE X 2)     Status: None   Collection Time    07/06/14  4:38 PM      Result Value Ref Range Status   Specimen Description BLOOD LEFT WRIST   Final   Special Requests BOTTLES DRAWN AEROBIC ONLY 1  CC   Final   Culture  Setup Time     Final   Value: 07/06/2014 21:51     Performed at [facility]   Culture     Final   Value: VIRIDANS STREPTOCOCCUS     Note: Gram Stain Report Called to,Read Back By and Verified With: CESAR KALOMBO 07/07/14 @ 4:21PM BY RUSCOE A.     Performed at [facility]   Report Status 07/09/2014 FINAL   Final  URINE CULTURE     Status: None   Collection Time    07/06/14  6:16 PM      Result Value Ref Range Status   Specimen Description URINE, CLEAN CATCH   Final   Special Requests NONE   Final   Culture  Setup Time     Final   Value: 07/07/2014 02:47     Performed at [facility] Count     Final   Value: 5,000 COLONIES/ML     Performed at [facility]   Culture     Final   Value: INSIGNIFICANT GROWTH     Performed at [facility]   Report Status 07/08/2014 FINAL   Final  CLOSTRIDIUM DIFFICILE BY PCR     Status: None   Collection Time    07/07/14  3:35 PM      Result Value Ref Range Status   C difficile by pcr NEGATIVE  NEGATIVE  Final  STOOL CULTURE     Status: None   Collection Time    07/08/14 11:39 AM      Result Value Ref Range Status   Specimen Description STOOL   Final   Special Requests NONE   Final   Culture     Final   Value: Culture reincubated for better growth     Performed at [facility]   Report Status PENDING   Incomplete     Studies: No results found.  Scheduled Meds: . benztropine  0.5 mg Oral BID  . famotidine  20 mg Oral Daily  . heparin  5,000 Units Subcutaneous 3 times per day  . levETIRAcetam  250 mg Oral BID  . lithium carbonate  300 mg Oral BID WC  . LORazepam  0.5 mg Oral BH-q7a  . memantine  10 mg Oral BID WC  . oxybutynin  10 mg Oral Daily  . risperiDONE  0.5 mg Oral Daily  . risperiDONE  2 mg Oral QHS  . sodium chloride  3 mL Intravenous Q12H   Continuous Infusions:    Marinda Elk  [facility] Pager [phone]. If 8PM-8AM, please contact night-coverage at [email or web address], password [facility] 07/10/2014, 11:09 AM  LOS: 4 days

## 2014-07-10 NOTE — Plan of Care (Signed)
Problem: Problem: Discharge Progression Goal: NO LEVEL 2 PASARR REQUIRED Outcome: Progressing Awaiting level 2 Passar

## 2014-07-10 NOTE — Progress Notes (Signed)
PT Cancellation Note  Patient Details Name: Shawn Wilcox MRN: [id number] DOB: [birth year]   Cancelled Treatment:     PT treatment canceled today due to patient's inability to participate at this time. Pt opened his eyes when spoken to, but when an attempt was made to mobilize the pt shut his eyes and would not participate. Will attempt again another day.   [name] 07/10/2014, 12:25 PM

## 2014-07-11 LAB — BASIC METABOLIC PANEL
Anion gap: 10 (ref 5–15)
BUN: 19 mg/dL (ref 6–23)
CALCIUM: 8.7 mg/dL (ref 8.4–10.5)
CO2: 21 mEq/L (ref 19–32)
Chloride: 113 mEq/L — ABNORMAL HIGH (ref 96–112)
Creatinine, Ser: 1.25 mg/dL (ref 0.50–1.35)
GFR calc Af Amer: 64 mL/min — ABNORMAL LOW (ref >90)
GFR calc non Af Amer: 55 mL/min — ABNORMAL LOW (ref >90)
Glucose, Bld: 114 mg/dL — ABNORMAL HIGH (ref 70–99)
Potassium: 3.9 mEq/L (ref 3.7–5.3)
SODIUM: 144 meq/L (ref 137–147)

## 2014-07-11 NOTE — Progress Notes (Signed)
Patient has order to transfer patient to facility. Transportation here to take pat to scheduled facility.  no n/v, pt has condom cath in place . No distress noted. Belongings sent with pt. And paperwork/ packet.

## 2014-07-11 NOTE — Progress Notes (Signed)
CSW (Clinical Child psychotherapist) received notification that pt PASARR has been received. CSW left voicemail for pt brother informing of such and that decision on facility will be needed today as soon as possible. CSW has asked for pt brother to please decide by about 1pm so facility of choice can be notified.  Coila Wardell, LCSWA [phone]

## 2014-07-11 NOTE — Progress Notes (Signed)
Clinical Social Work Department BRIEF PSYCHOSOCIAL ASSESSMENT 07/11/2014  Patient:  Shawn Wilcox     Account Number:  [id number]     Admit date:  07/02/2014  Clinical Social Worker:  Shawn Wilcox  Date/Time:  07/11/2014 03:00 PM  Referred by:  Physician  Date Referred:  07/11/2014 Referred for  SNF Placement   Other Referral:   Interview type:  Family Other interview type:   Spoke with pt daughter on the phone and then spoke with pt at bedside    PSYCHOSOCIAL DATA Living Status:  WIFE Admitted from facility:   Level of care:   Primary support name:  Shawn Wilcox Primary support relationship to patient:  CHILD, ADULT Degree of support available:   Pt has strong family support    CURRENT CONCERNS Current Concerns  Post-Acute Placement   Other Concerns:    SOCIAL WORK ASSESSMENT / PLAN CSW made aware that pt will need SNF at dc. CSW attempted to call pt spouse but unable to reach. CSW called pt daughter Shawn Wilcox who informed CSW that pt has a negative expereince with SNF in the past. Pt previously went to SNF and was very dissatisfied. Per pt daughter, it was not necessarily the specific facility that was the issue but the concept of ST rehab at a SNF that pt had a hard time with. Pt daughte reported that pt was very unhappy during the time period when he was at a SNF and did not progress well. Pt did return home with home health services after SNF stay and pt daughter reports pt was able to gain more from this service. Pt daughter informed CSW that pt spouse, children , and pt spouse's children will all be available to help with care at discharge. Pt daughter wanting CSW to ask pt if he would like to return to his home closer to his children or to his spouse's home. Per pt daughter, pt and pt spouse live together but maintain two different residences. CSW spoke with pt who was still slightly confused. However, pt seemed to be understanding of conversation and clearly stated  he would like to return to his spouse Shawn Wilcox's home. Pt daughter to find out which home health company pt used in the past. CSW notified her that RN CM would be in touch to arrange home care. CSW notified RNCM. CSW signing off.   Assessment/plan status:  No Further Intervention Required Other assessment/ plan:   Information/referral to community resources:   SNF list denied    PATIENT'S/FAMILY'S RESPONSE TO PLAN OF CARE: Pt and pt family would like for pt to dc home.       Shawn Wilcox, LCSWA [phone]

## 2014-07-11 NOTE — Progress Notes (Signed)
CSW (Clinical Child psychotherapist) received call from pt brother and he informed CSW they would like to accept bed at [facility]. CSW notified facility. Pt brother to sign paperwork at facility at 4pm and then CSW will arrange for transport.  Clyde Upshaw, LCSWA [phone]

## 2014-07-11 NOTE — Progress Notes (Signed)
CSW (Clinical Social Worker) prepared pt dc packet and placed with shadow chart. CSW arranged non-emergent ambulance transport. Pt family, pt nurse, and facility informed. CSW signing off.  Shishir Krantz, LCSWA [phone]  

## 2014-07-11 NOTE — Progress Notes (Signed)
[facility] PROGRESS NOTE  Shawn Wilcox UXL:[id number] DOB: May 26, 1941 DOA: 07/06/2014 PCP: Gaye Alken, MD  Assessment/Plan: #1 acute encephalopathy/acute renal failure Felt to be secondary to prerenal azotemia and dehydration. Patient was hydrated with IV fluids with resolution of acute renal failure. Lithium levels were within normal levels. Patient improved and was back to baseline by day of discharge.  #2 near syncope No events noted on telemetry. Cardiac enzymes were negative. Patient was hydrated with IV fluids. No further syncopal episodes.  #3 dementia/mental retardation/mood disorder Remained stable throughout the hospitalization. Patient was maintained on Cogentin, Keppra, lithium, Namenda, Risperdal.   #4 Fall/abnormal gait/generalized weakness  - Request physical therapy and occupational therapy at home.  No additional changes to prior discharge summary done on 07/08/2014. Please see discharge summary from 07/08/2014.    Code Status: Full Family Communication: Updated patient no family at bedside. Disposition Plan: Discharge to skilled nursing facility   Consultants:  None  Procedures:  Chest x-ray 07/06/2014  X-ray of the hips 07/06/2014  Antibiotics:  None  HPI/Subjective: Patient alert and following commands. Patient denies any chest pain. No shortness of breath.  Objective: Filed Vitals:   07/11/14 1403  BP: 117/60  Pulse: 95  Temp: 99.6 F (37.6 C)  Resp: 21    Intake/Output Summary (Last 24 hours) at 07/11/14 1533 Last data filed at 07/11/14 1317  Gross per 24 hour  Intake    603 ml  Output   3000 ml  Net  -2397 ml   Filed Weights   07/08/14 0500 07/09/14 0500 07/11/14 0500  Weight: 93.441 kg (206 lb) 96.616 kg (213 lb) 92.4 kg (203 lb 11.3 oz)    Exam:   General:  NAD  Cardiovascular: RRR  Respiratory: CTAB  Abdomen: Soft, nontender, nondistended, positive bowel sounds  Musculoskeletal: No clubbing  cyanosis or edema  Data Reviewed: Basic Metabolic Panel:  Recent Labs Lab 07/06/14 1634 07/07/14 0335 07/11/14 0925  NA 136* 141 144  K 3.6* 3.5* 3.9  CL 103 110 113*  CO2 18* 19 21  GLUCOSE 107* 94 114*  BUN 41* 37* 19  CREATININE 2.17* 1.62* 1.25  CALCIUM 8.5 8.0* 8.7   Liver Function Tests:  Recent Labs Lab 07/06/14 1634  AST 23  ALT 15  ALKPHOS 85  BILITOT 0.3  PROT 6.3  ALBUMIN 3.1*    Recent Labs Lab 07/06/14 1634  LIPASE 14   No results found for this basename: AMMONIA,  in the last 168 hours CBC:  Recent Labs Lab 07/06/14 1634 07/07/14 0335  WBC 4.7 4.2  NEUTROABS 3.7  --   HGB 11.5* 11.0*  HCT 34.4* 34.1*  MCV 95.6 98.3  PLT 124* 110*   Cardiac Enzymes:  Recent Labs Lab 07/06/14 07/07/14 0335  TROPONINI <0.30 <0.30   BNP (last 3 results) No results found for this basename: PROBNP,  in the last 8760 hours CBG: No results found for this basename: GLUCAP,  in the last 168 hours  Recent Results (from the past 240 hour(s))  CULTURE, BLOOD (ROUTINE X 2)     Status: None   Collection Time    07/06/14  4:34 PM      Result Value Ref Range Status   Specimen Description BLOOD LEFT ARM   Final   Special Requests BOTTLES DRAWN AEROBIC AND ANAEROBIC 10 CC   Final   Culture  Setup Time     Final   Value: 07/06/2014 21:52     Performed at [facility]  Culture     Final   Value:        BLOOD CULTURE RECEIVED NO GROWTH TO DATE CULTURE WILL BE HELD FOR 5 DAYS BEFORE ISSUING A FINAL NEGATIVE REPORT     Performed at [facility]   Report Status PENDING   Incomplete  CULTURE, BLOOD (ROUTINE X 2)     Status: None   Collection Time    07/06/14  4:38 PM      Result Value Ref Range Status   Specimen Description BLOOD LEFT WRIST   Final   Special Requests BOTTLES DRAWN AEROBIC ONLY 1 CC   Final   Culture  Setup Time     Final   Value: 07/06/2014 21:51     Performed at [facility]   Culture     Final   Value: VIRIDANS  STREPTOCOCCUS     Note: Gram Stain Report Called to,Read Back By and Verified With: CESAR KALOMBO 07/07/14 @ 4:21PM BY RUSCOE A.     Performed at [facility]   Report Status 07/09/2014 FINAL   Final  URINE CULTURE     Status: None   Collection Time    07/06/14  6:16 PM      Result Value Ref Range Status   Specimen Description URINE, CLEAN CATCH   Final   Special Requests NONE   Final   Culture  Setup Time     Final   Value: 07/07/2014 02:47     Performed at [facility] Count     Final   Value: 5,000 COLONIES/ML     Performed at [facility]   Culture     Final   Value: INSIGNIFICANT GROWTH     Performed at [facility]   Report Status 07/08/2014 FINAL   Final  CLOSTRIDIUM DIFFICILE BY PCR     Status: None   Collection Time    07/07/14  3:35 PM      Result Value Ref Range Status   C difficile by pcr NEGATIVE  NEGATIVE Final  STOOL CULTURE     Status: None   Collection Time    07/08/14 11:39 AM      Result Value Ref Range Status   Specimen Description STOOL   Final   Special Requests NONE   Final   Culture     Final   Value: NO SUSPICIOUS COLONIES, CONTINUING TO HOLD     Performed at [facility]   Report Status PENDING   Incomplete     Studies: No results found.  Scheduled Meds: . benztropine  0.5 mg Oral BID  . famotidine  20 mg Oral Daily  . heparin  5,000 Units Subcutaneous 3 times per day  . levETIRAcetam  250 mg Oral BID  . lithium carbonate  300 mg Oral BID WC  . LORazepam  0.5 mg Oral BH-q7a  . memantine  10 mg Oral BID WC  . oxybutynin  10 mg Oral Daily  . potassium chloride  40 mEq Oral Once  . risperiDONE  0.5 mg Oral Daily  . risperiDONE  2 mg Oral QHS  . sodium chloride  3 mL Intravenous Q12H   Continuous Infusions:   Principal Problem:   Acute encephalopathy Active Problems:   Primary generalized seizure disorder   Abnormality of gait   Mental retardation   Mood disorder   Acute renal  failure   Generalized weakness   Near syncope  Dementia    Time spent: 35 Mins    [facility] MD [facility] Pager [phone]. If 7PM-7AM, please contact night-coverage at [email or web address], password [facility] 07/11/2014, 3:33 PM  LOS: 5 days

## 2014-07-12 LAB — STOOL CULTURE

## 2014-07-12 LAB — CULTURE, BLOOD (ROUTINE X 2): Culture: NO GROWTH

## 2014-09-11 ENCOUNTER — Encounter: Payer: Self-pay | Admitting: Neurology

## 2015-01-11 ENCOUNTER — Encounter (HOSPITAL_COMMUNITY): Payer: Self-pay | Admitting: Emergency Medicine

## 2015-01-11 ENCOUNTER — Emergency Department (HOSPITAL_COMMUNITY): Payer: Medicare Other

## 2015-01-11 ENCOUNTER — Emergency Department (HOSPITAL_COMMUNITY)
Admission: EM | Admit: 2015-01-11 | Discharge: 2015-01-11 | Disposition: A | Discharge status: Home / Self Care | Payer: Medicare Other | Attending: Emergency Medicine | Admitting: Emergency Medicine

## 2015-01-11 DIAGNOSIS — F0390 Unspecified dementia without behavioral disturbance: Secondary | ICD-10-CM | POA: Diagnosis not present

## 2015-01-11 DIAGNOSIS — T17308A Unspecified foreign body in larynx causing other injury, initial encounter: Secondary | ICD-10-CM

## 2015-01-11 DIAGNOSIS — Z79899 Other long term (current) drug therapy: Secondary | ICD-10-CM | POA: Insufficient documentation

## 2015-01-11 DIAGNOSIS — G40909 Epilepsy, unspecified, not intractable, without status epilepticus: Secondary | ICD-10-CM | POA: Insufficient documentation

## 2015-01-11 DIAGNOSIS — R05 Cough: Secondary | ICD-10-CM | POA: Insufficient documentation

## 2015-01-11 DIAGNOSIS — F79 Unspecified intellectual disabilities: Secondary | ICD-10-CM | POA: Insufficient documentation

## 2015-01-11 DIAGNOSIS — Z8719 Personal history of other diseases of the digestive system: Secondary | ICD-10-CM | POA: Insufficient documentation

## 2015-01-11 DIAGNOSIS — F419 Anxiety disorder, unspecified: Secondary | ICD-10-CM | POA: Insufficient documentation

## 2015-01-11 DIAGNOSIS — R0989 Other specified symptoms and signs involving the circulatory and respiratory systems: Secondary | ICD-10-CM | POA: Insufficient documentation

## 2015-01-11 DIAGNOSIS — R0602 Shortness of breath: Secondary | ICD-10-CM | POA: Diagnosis present

## 2015-01-11 HISTORY — DX: Gastro-esophageal reflux disease without esophagitis: K21.9

## 2015-01-11 HISTORY — DX: Unspecified dementia, unspecified severity, without behavioral disturbance, psychotic disturbance, mood disturbance, and anxiety: F03.90

## 2015-01-11 HISTORY — DX: Anxiety disorder, unspecified: F41.9

## 2015-01-11 NOTE — ED Notes (Signed)
Pt arrived via EMS with report of choking on some fruit. NF facility reported that pt had coughed up frothy sputum. EMS reported upon arrival noted no SOB, lungs clear, and no secretions. Pt verbally responsive and acting to normal per facility. Pt from [facility].

## 2015-01-11 NOTE — ED Notes (Signed)
Communication called for PTAR transport back to facility. 

## 2015-01-11 NOTE — ED Provider Notes (Signed)
CSN: [id number]     Arrival date & time 01/11/15  2050 History   First MD Initiated Contact with Patient 01/11/15 2053     Chief Complaint  Patient presents with  . Shortness of Breath     (Consider location/radiation/quality/duration/timing/severity/associated sxs/prior Treatment) Patient is a 74 y.o. male presenting with shortness of breath. The history is provided by the patient.  Shortness of Breath Severity:  Mild Onset quality:  Sudden Timing:  Constant Progression:  Resolved Chronicity:  New Context comment:  Was eating and then began choking Relieved by:  Nothing Worsened by:  Nothing tried Associated symptoms: cough (with frothy sputum)   Associated symptoms: no fever     Past Medical History  Diagnosis Date  . Mental retardation   . Seizures   . Mood disorder   . GERD (gastroesophageal reflux disease)   . Anxiety   . Dementia    Past Surgical History  Procedure Laterality Date  . None     Family History  Problem Relation Age of Onset  . Family history unknown: Yes   History  Substance Use Topics  . Smoking status: Never Smoker   . Smokeless tobacco: Never Used  . Alcohol Use: No    Review of Systems  Constitutional: Negative for fever.  Respiratory: Positive for cough (with frothy sputum). Negative for shortness of breath.   All other systems reviewed and are negative.     Allergies  Review of patient's allergies indicates no known allergies.  Home Medications   Prior to Admission medications   Medication Sig Start Date End Date Taking? Authorizing Provider  acetaminophen (TYLENOL) 650 MG CR tablet Take 650 mg by mouth every 8 (eight) hours.   Yes Historical Provider, MD  benztropine (COGENTIN) 0.5 MG tablet Take 0.5 mg by mouth 2 (two) times daily.   Yes Historical Provider, MD  cholecalciferol (VITAMIN D) 1000 UNITS tablet Take 2,000 Units by mouth daily.   Yes Historical Provider, MD  famotidine (PEPCID) 20 MG tablet Take 20 mg by mouth  daily.   Yes Historical Provider, MD  levETIRAcetam (KEPPRA) 250 MG tablet Take 250 mg by mouth 2 (two) times daily.   Yes Historical Provider, MD  lithium carbonate 300 MG capsule Take 300 mg by mouth 2 (two) times daily with a meal.   Yes Historical Provider, MD  LORazepam (ATIVAN) 0.5 MG tablet Take 1 tablet (0.5 mg total) by mouth every morning. 07/08/14  Yes Marinda Elk, MD  memantine (NAMENDA) 10 MG tablet Take 10 mg by mouth 2 (two) times daily.   Yes Historical Provider, MD  oxybutynin (DITROPAN-XL) 10 MG 24 hr tablet Take 10 mg by mouth daily.   Yes Historical Provider, MD  risperiDONE (RISPERDAL) 0.5 MG tablet Take 0.5 mg by mouth every morning.    Yes Historical Provider, MD  risperiDONE (RISPERDAL) 2 MG tablet Take 2 mg by mouth at bedtime.   Yes Historical Provider, MD   BP 111/73 mmHg  Pulse 59  Temp(Src) 97.9 F (36.6 C) (Oral)  Resp 16  SpO2 95% Physical Exam  Constitutional: He appears well-developed and well-nourished. No distress.  HENT:  Head: Normocephalic and atraumatic.  Mouth/Throat: Oropharynx is clear and moist. No oropharyngeal exudate.  Eyes: EOM are normal. Pupils are equal, round, and reactive to light.  Neck: Normal range of motion. Neck supple.  Cardiovascular: Normal rate and regular rhythm.  Exam reveals no friction rub.   No murmur heard. Pulmonary/Chest: Effort normal and breath sounds normal.  No respiratory distress. He has no wheezes. He has no rales.  Abdominal: Soft. He exhibits no distension. There is no tenderness. There is no rebound.  Musculoskeletal: Normal range of motion. He exhibits no edema.  Neurological: He is alert. No cranial nerve deficit. He exhibits normal muscle tone.  Skin: No rash noted. He is not diaphoretic.  Nursing note and vitals reviewed.   ED Course  Procedures (including critical care time) Labs Review Labs Reviewed - No data to display  Imaging Review No results found.   EKG Interpretation None       MDM   Final diagnoses:  Choking    74 year old male here after choking on food. He is coughing up some frothy sputum. He is at baseline upon EMS arrival. Here he is relaxing comfortably and in no apparent distress for distress. He denies any pain. He does have history of MR so he is noncommunicative. We'll check chest x-ray. Lungs are clear. Airway is clear. All extremities with normal strength. Cranial nerves grossly intact. Doubt stroke.  Xray ok. Stable for discharge.  Elwin MochaBlair Prithvi Kooi, MD 01/11/15 [phone]

## 2015-01-11 NOTE — ED Notes (Signed)
Awake. Verbally responsive. A/O x4. Resp even and unlabored. No audible adventitious breath sounds noted. ABC's intact.  

## 2015-01-11 NOTE — Discharge Instructions (Signed)
Choking °Choking occurs when a food or object gets stuck in the throat or trachea, blocking the airway. If the airway is partly blocked, coughing will usually cause the food or object to come out. If the airway is completely blocked, immediate action is needed to help it come out. A complete airway blockage is life threatening because it causes breathing to stop. Choking is a true medical emergency that requires fast, appropriate action by anyone available. °SIGNS OF AIRWAY BLOCKAGE °There is a partial airway blockage if you or the person who is choking is:  °· Able to breathe and speak. °· Coughing loudly. °· Making loud noises. °There is a complete airway blockage if you or the person who is choking is:  °· Unable to breathe. °· Making soft or high-pitched sounds while breathing. °· Unable to cough or coughing weakly, ineffectively, or silently. °· Unable to cry, speak, or make sounds. °· Turning blue. °· Holding the neck with both arms. This is the universal sign of choking. °WHAT TO DO IF CHOKING OCCURS °If there is a partial airway blockage, allow coughing to clear the airway. Do not try to drink until the food or object comes out. If someone else has a partial airway blockage, do not interfere. Stay with him or her and watch for signs of complete airway blockage until the food or object comes out.  °If there is a complete airway blockage or if there is a partial airway blockage and the food or object does not come out, perform abdominal thrusts (also referred to as the Heimlich maneuver). Abdominal thrusts are used to create an artificial cough to try to clear the airway. Performing abdominal thrusts is part of a series of steps that should be done to help someone who is choking. Abdominal thrusts are usually done by someone else, but if you are alone, you can perform abdominal thrusts on yourself. Follow the procedure below that best fits your situation.  °IF SOMEONE ELSE IS CHOKING: °For a conscious adult:    °1. Ask the person whether he or she is choking. If the person nods, continue to step 2. °2. Stand or kneel behind the person and lean him or her forward slightly. °3. Make a fist with 1 hand, put your arms around the person, and grasp your fist with your other hand. Place the thumb side of your fist in the person's abdomen, just below the ribs. °4. Press inward and upward with both hands. °5. Repeat this maneuver until the object comes out and the person is able to breathe or until the person loses consciousness. °For an unconscious adult: °1. Shout for help. If someone responds, have him or her call local emergency services (911 in [place] If no one responds, call local emergency services yourself if possible. °2. Begin CPR, starting with compressions. Every time you open the airway to give rescue breaths, open the person's mouth. If you can see the food or object and it can be easily pulled out, remove it with your fingers. °3. After 5 cycles or 2 minutes of CPR, call local emergency services (911 in [place] if you or someone else did not already call. °For a conscious adult who is obese or in the later stages of pregnancy: °Abdominal thrusts may not be effective when helping people who are in the later stages of pregnancy or who are obese. In these instances, chest thrusts can be used.  °1. Ask the person whether he or she is choking. If the person   nods and has signs of complete airway blockage, continue to step 2. °2. Stand behind the person and wrap your arms around his or her chest (with your arms under the person's armpits). °3. Make a fist with 1 hand. Place the thumb side of your fist on the middle of the person's breastbone. °4. Grab your fist with your other hand and thrust backward. Continue this until the object comes out or until the person becomes unconscious. °For an unconscious adult who is obese or in the later stages of pregnancy:  °1. Shout for help. If someone responds, have him or her call local  emergency services (911 in [place] If no one responds, call local emergency services yourself if possible. °2. Begin CPR, starting with compressions. Every time you open the airway to give rescue breaths, open the person's mouth. If you can see the food or object and it can be easily pulled out, remove it with your fingers. °3. After 5 cycles or 2 minutes of CPR, call local emergency services (911 in [place] if you or someone else did not already call. °Note that abdominal thrusts (below the rib cage) should be used for a pregnant woman if possible. This should be possible until the later stages of pregnancy when there is no longer enough room between the enlarging uterus and the rib cage to perform the maneuver. At that point, chest thrusts must be used as described. °IF YOU ARE CHOKING: °1. Call local emergency services (911 in [place] if near a landline. Do not worry about communicating what is happening. Do not hang up the phone. Someone may be sent to help you anyway. °2. Make a fist with 1 hand. Put the thumb side of the fist against your stomach, just above the belly button and well below the breastbone. If you are pregnant or obese, put your fist on your chest instead, just below the breastbone and just above your lowest ribs. °3. Hold your fist with your other hand and bend over a hard surface, such as a table or chair. °4. Forcefully push your fist in and up. °5. Continue to do this until the food or object comes out. °PREVENTION  °To be prepared if choking occurs, learn how to correctly perform abdominal thrusts and give CPR by taking a certified first-aid training course.  °SEEK IMMEDIATE MEDICAL CARE IF: °· You have a fever after choking stops. °· You have problems breathing after choking stops. °· You received the Heimlich maneuver. °MAKE SURE YOU: °· Understand these instructions. °· Watch your condition. °· Get help right away if you are not doing well or get worse. °Document Released: 11/11/2004 Document  Revised: 02/18/2014 Document Reviewed: 05/16/2012 °Exit[employer]® Patient Information ©2015 [employer]. This information is not intended to replace advice given to you by your health care provider. Make sure you discuss any questions you have with your health care provider. ° °

## 2015-01-11 NOTE — ED Notes (Signed)
Bed: WA08 Expected date:  Expected time:  Means of arrival:  Comments: EMS choked on fruit

## 2015-01-11 NOTE — ED Notes (Signed)
Awake. Verbally responsive. A/O x1 (self). Resp even and unlabored. No audible adventitious breath sounds noted. ABC's intact.  

## 2015-01-11 NOTE — ED Notes (Signed)
Called report to Lurena Joinerebecca, LPN at [facility]. Verbalized understanding.

## 2015-04-17 ENCOUNTER — Ambulatory Visit (INDEPENDENT_AMBULATORY_CARE_PROVIDER_SITE_OTHER): Payer: Medicare Other | Admitting: Nurse Practitioner

## 2015-04-17 ENCOUNTER — Encounter: Payer: Self-pay | Admitting: Nurse Practitioner

## 2015-04-17 VITALS — BP 111/71 | HR 63

## 2015-04-17 DIAGNOSIS — R269 Unspecified abnormalities of gait and mobility: Secondary | ICD-10-CM | POA: Diagnosis not present

## 2015-04-17 DIAGNOSIS — F79 Unspecified intellectual disabilities: Secondary | ICD-10-CM

## 2015-04-17 DIAGNOSIS — G40319 Generalized idiopathic epilepsy and epileptic syndromes, intractable, without status epilepticus: Secondary | ICD-10-CM | POA: Diagnosis not present

## 2015-04-17 DIAGNOSIS — F0390 Unspecified dementia without behavioral disturbance: Secondary | ICD-10-CM | POA: Diagnosis not present

## 2015-04-17 NOTE — Patient Instructions (Signed)
Per nursing home sheet 

## 2015-04-17 NOTE — Progress Notes (Signed)
I have reviewed and agreed above plan. 

## 2015-04-17 NOTE — Progress Notes (Signed)
[facility]  PATIENT: Shawn Wilcox DOB: [birth year]   REASON FOR VISIT: follow-up for epilepsy, mental retardation, mood disorder HISTORY FROM:sister-in-law and patient    HISTORY OF PRESENT ILLNESS:Shawn Wilcox is a 74 years old right-handed Caucasian male,came in to followup for his epilepsy disorder, he is accompanied by his caregiver, [name], who has known him since 2011, his primary care physician is Dr. Juluis Rainier. He was last seen in July 2014 by [name]. He was born with mental retardation, had epilepsy since age 6, forceful head deviation to left, followed by tonic clonic activity, he has multiple recurrent episodes for one year, but since he was on dilantin ,he no longer has seizure. In 2011, he was switched from Dilantin to Keppra 250 mg twice a day, he has no recurrent seizure.  He is also seeing a psychiatrist for his mood disorder, is taking lithium, Risperdal. There have been no issues with behavior, no falls. He goes to a day program. He has gradual onset unsteady gait for few years, falling multiple times recently, is receiving physical therapy. It is difficult to get a detailed history from patient, but he denies significant pain, occasionally incontinence if he has to wait in line, he did not complain and all feet paresthesia, he has good appetite, sleeps well, UPDATE 04/17/15 Shawn Wilcox, 74 year old male returns for follow-up. He was last seen in this office 04/17/2014 by Dr. Terrace Arabia.He has since moved to a nursing facility , [facility]. He has had one fall since last seen. He has not had any seizure activity since 2011. He continues to see a psychiatrist for his mood disorder, there have been no behavior issues. He has good appetite and he sleeps well at night. He returns for reevaluation   REVIEW OF SYSTEMS: Full 14 system review of systems performed and notable only for those listed, all others are neg:  Constitutional: neg    Cardiovascular: neg Ear/Nose/Throat: neg  Skin: neg Eyes: neg Respiratory: neg Gastroitestinal: neg  Hematology/Lymphatic: neg  Endocrine: neg Musculoskeletal:neg Allergy/Immunology: neg Neurological: neg Psychiatric: neg Sleep : neg   ALLERGIES: No Known Allergies  HOME MEDICATIONS: Outpatient Prescriptions Prior to Visit  Medication Sig Dispense Refill  . acetaminophen (TYLENOL) 650 MG CR tablet Take 650 mg by mouth every 8 (eight) hours.    . benztropine (COGENTIN) 0.5 MG tablet Take 0.5 mg by mouth 2 (two) times daily.    . cholecalciferol (VITAMIN D) 1000 UNITS tablet Take 2,000 Units by mouth daily.    . famotidine (PEPCID) 20 MG tablet Take 20 mg by mouth daily.    Marland Kitchen levETIRAcetam (KEPPRA) 250 MG tablet Take 250 mg by mouth 2 (two) times daily.    Marland Kitchen lithium carbonate 300 MG capsule Take 300 mg by mouth 2 (two) times daily with a meal.    . LORazepam (ATIVAN) 0.5 MG tablet Take 1 tablet (0.5 mg total) by mouth every morning. 30 tablet 0  . memantine (NAMENDA) 10 MG tablet Take 10 mg by mouth 2 (two) times daily.    Marland Kitchen oxybutynin (DITROPAN-XL) 10 MG 24 hr tablet Take 10 mg by mouth daily.    . risperiDONE (RISPERDAL) 0.5 MG tablet Take 0.5 mg by mouth every morning.     . risperiDONE (RISPERDAL) 2 MG tablet Take 2 mg by mouth at bedtime.     No facility-administered medications prior to visit.    PAST MEDICAL HISTORY: Past Medical History  Diagnosis Date  . Mental retardation   . Seizures   .  Mood disorder   . GERD (gastroesophageal reflux disease)   . Anxiety   . Dementia     PAST SURGICAL HISTORY: Past Surgical History  Procedure Laterality Date  . None      FAMILY HISTORY: Family History  Problem Relation Age of Onset  . Family history unknown: Yes    SOCIAL HISTORY: History   Social History  . Marital Status: Single    Spouse Name: N/A  . Number of Children: 0  . Years of Education: N/A   Occupational History  . Not on file.   Social  History Main Topics  . Smoking status: Never Smoker   . Smokeless tobacco: Never Used  . Alcohol Use: No  . Drug Use: No  . Sexual Activity: Not on file   Other Topics Concern  . Not on file   Social History Narrative   ** Merged History Encounter   [facility] [phone]         PHYSICAL EXAM  Filed Vitals:   04/17/15 1534  BP: 111/71  Pulse: 63   There is no weight on file to calculate BMI. Generalized: In no acute distress Neck: Supple, no carotid bruits  Musculoskeletal: No deformity  Neurological examination  Mentation: Depending on his caregiver to provide history, cooperative, but difficult to follow three-step neurological examination  Cranial nerve II-XII: Pupils were equal round reactive to light. Extraocular movements were full. Visual field were full on confrontational test. Facial sensation and strength were normal. Hearing was intact to finger rubbing bilaterally. Uvula tongue midline. Head turning and shoulder shrug and were normal and symmetric.Tongue protrusion into cheek strength was normal. Motor: Normal tone, bulk and strength. Sensory: Withdraws to pain Coordination: Normal finger to nose, he has dysmetria at bilateral heel-to-shin. Gait: in wheelchair not ambulated Deep tendon reflexes: Brachioradialis 2/2, biceps 2/2, triceps 2/2, patellar 2/2, Achilles 1/1, plantar responses were flexor bilaterally.   DIAGNOSTIC DATA (LABS, IMAGING, TESTING) - I reviewed patient records, labs, notes, testing and imaging myself where available.  Lab Results  Component Value Date   WBC 4.2 07/07/2014   HGB 11.0* 07/07/2014   HCT 34.1* 07/07/2014   MCV 98.3 07/07/2014   PLT 110* 07/07/2014      Component Value Date/Time   NA 144 07/11/2014 0925   K 3.9 07/11/2014 0925   CL 113* 07/11/2014 0925   CO2 21 07/11/2014 0925   GLUCOSE 114* 07/11/2014 0925   BUN 19 07/11/2014 0925   CREATININE 1.25 07/11/2014 0925   CALCIUM 8.7 07/11/2014 0925     PROT 6.3 07/06/2014 1634   ALBUMIN 3.1* 07/06/2014 1634   AST 23 07/06/2014 1634   ALT 15 07/06/2014 1634   ALKPHOS 85 07/06/2014 1634   BILITOT 0.3 07/06/2014 1634   GFRNONAA 55* 07/11/2014 0925   GFRAA 64* 07/11/2014 0925    ASSESSMENT AND PLAN  74 y.o. year old male  has a past medical history of Mental retardation; gait abnormalitySeizures; Mood disorder;  Anxiety; and Dementia. here to follow-up for his seizure disorder. Last seizure in 2011. Continue Keppra 250 twice daily At risk for falls use walker at all times Follow-up yearly and when necessary Nilda RiggsNancy [name] Martin, [facility] LLCGNP, [facility]BC, APRN  [facility] [address], [address] [place], [place] [address] [phone]

## 2015-04-18 ENCOUNTER — Ambulatory Visit: Payer: PRIVATE HEALTH INSURANCE | Admitting: Nurse Practitioner

## 2016-04-08 ENCOUNTER — Telehealth: Payer: Self-pay | Admitting: *Deleted

## 2016-04-08 ENCOUNTER — Ambulatory Visit (INDEPENDENT_AMBULATORY_CARE_PROVIDER_SITE_OTHER): Payer: Self-pay | Admitting: Nurse Practitioner

## 2016-04-08 DIAGNOSIS — Z0289 Encounter for other administrative examinations: Secondary | ICD-10-CM

## 2016-04-08 NOTE — Telephone Encounter (Signed)
I called [facility] explained that pt arrived for appt.  No paperwork, and no one with him.  He will be rescheduled 04-22-16 at 1630, arrive [phone].  Spoke with coordinator.  Apparently family , who usually comes with pt did not get message or just did not come.

## 2016-04-14 ENCOUNTER — Ambulatory Visit: Payer: Medicare Other | Admitting: Nurse Practitioner

## 2016-04-22 ENCOUNTER — Ambulatory Visit (INDEPENDENT_AMBULATORY_CARE_PROVIDER_SITE_OTHER): Payer: Medicare Other | Admitting: Nurse Practitioner

## 2016-04-22 ENCOUNTER — Encounter: Payer: Self-pay | Admitting: Nurse Practitioner

## 2016-04-22 VITALS — BP 127/83 | HR 64

## 2016-04-22 DIAGNOSIS — R269 Unspecified abnormalities of gait and mobility: Secondary | ICD-10-CM

## 2016-04-22 DIAGNOSIS — G40319 Generalized idiopathic epilepsy and epileptic syndromes, intractable, without status epilepticus: Secondary | ICD-10-CM

## 2016-04-22 DIAGNOSIS — F0390 Unspecified dementia without behavioral disturbance: Secondary | ICD-10-CM | POA: Diagnosis not present

## 2016-04-22 NOTE — Progress Notes (Signed)
[facility]  PATIENT: Shawn Wilcox DOB: January 19, 1941   REASON FOR VISIT: Follow-up for epilepsy, abnormality of gait mental retardation and dementia HISTORY FROM: Sister in law    HISTORY OF PRESENT ILLNESS:Shawn Wilcox is a 75 years old right-handed Caucasian male,came in to followup for his epilepsy disorder, he is accompanied by his caregiver, Shawn Wilcox, who has known him since 2011, his primary care physician is Dr. Juluis RainierElizabeth Wilcox. He was last seen in July 2014 by Shawn Wilcox. He was born with mental retardation, had epilepsy since age 75, forceful head deviation to left, followed by tonic clonic activity, he has multiple recurrent episodes for one year, but since he was on dilantin 100mg ,he no longer has seizure. In 2011, he was switched from Dilantin to Keppra 250 mg twice a day, he has no recurrent seizure.  He is also seeing a psychiatrist for his mood disorder, is taking lithium, Risperdal. There have been no issues with behavior, no falls. He goes to a day program. He has gradual onset unsteady gait for few years, falling multiple times recently, is receiving physical therapy. It is difficult to get a detailed history from patient, but he denies significant pain, occasionally incontinence if he has to wait in line, he did not complain and all feet paresthesia, he has good appetite, sleeps well, UPDATE 04/17/15 Shawn Wilcox, 75 year old male returns for follow-up. He was last seen in this office 04/17/2014 by Shawn Wilcox.He has since moved to a nursing facility , [facility]. He has had one fall since last seen. He has not had any seizure activity since 2011. He continues to see a psychiatrist for his mood disorder, there have been no behavior issues. He has good appetite and he sleeps well at night. He returns for reevaluation UPDATE 07/06/2017[name], 75 year old male returns for follow-up. He has a history of seizure disorder with last seizure activity in 2011  currently well-controlled on Keppra 250 twice daily. He also has a history of dementia and MR. He currently resides in a skilled facility. He is not ambulatory. Appetite is good there have been no behavior issues and he is sleeping well. He returns for reevaluation with his sister in law who provides the history  REVIEW OF SYSTEMS: Full 14 system review of systems performed and notable only for those listed, all others are neg:  Constitutional: neg  Cardiovascular: neg Ear/Nose/Throat: neg  Skin: neg Eyes: neg Respiratory: neg Gastroitestinal: neg  Hematology/Lymphatic: neg  Endocrine: neg Musculoskeletal:neg Allergy/Immunology: neg Neurological: neg Psychiatric: neg Sleep : neg   ALLERGIES: No Known Allergies  HOME MEDICATIONS: Outpatient Prescriptions Prior to Visit  Medication Sig Dispense Refill  . acetaminophen (TYLENOL) 650 MG CR tablet Take 650 mg by mouth every 8 (eight) hours.    . benztropine (COGENTIN) 0.5 MG tablet Take 0.5 mg by mouth 2 (two) times daily.    . cholecalciferol (VITAMIN D) 1000 UNITS tablet Take 2,000 Units by mouth daily.    . famotidine (PEPCID) 20 MG tablet Take 20 mg by mouth daily.    Marland Kitchen. levETIRAcetam (KEPPRA) 250 MG tablet Take 250 mg by mouth 2 (two) times daily.    Marland Kitchen. lithium carbonate 300 MG capsule Take 300 mg by mouth 2 (two) times daily with a meal.    . LORazepam (ATIVAN) 0.5 MG tablet Take 1 tablet (0.5 mg total) by mouth every morning. 30 tablet 0  . memantine (NAMENDA) 10 MG tablet Take 10 mg by mouth 2 (two) times daily.    .Marland Kitchen  oxybutynin (DITROPAN-XL) 10 MG 24 hr tablet Take 10 mg by mouth daily.    . risperiDONE (RISPERDAL) 0.5 MG tablet Take 0.5 mg by mouth every morning.     . risperiDONE (RISPERDAL) 2 MG tablet Take 2 mg by mouth at bedtime.    . traMADol (ULTRAM) 50 MG tablet Take 50 mg by mouth every 6 (six) hours as needed.     No facility-administered medications prior to visit.    PAST MEDICAL HISTORY: Past Medical History    Diagnosis Date  . Mental retardation   . Seizures (HCC)   . Mood disorder (HCC)   . GERD (gastroesophageal reflux disease)   . Anxiety   . Dementia     PAST SURGICAL HISTORY: Past Surgical History  Procedure Laterality Date  . None      FAMILY HISTORY: Family History  Problem Relation Age of Onset  . Family history unknown: Yes    SOCIAL HISTORY: Social History   Social History  . Marital Status: Single    Spouse Name: N/A  . Number of Children: 0  . Years of Education: N/A   Occupational History  . Not on file.   Social History Main Topics  . Smoking status: Never Smoker   . Smokeless tobacco: Never Used  . Alcohol Use: No  . Drug Use: No  . Sexual Activity: Not on file   Other Topics Concern  . Not on file   Social History Narrative   ** Merged History Encounter   [facility] [phone]         PHYSICAL EXAM  Filed Vitals:   04/22/16 1645  BP: 127/83  Pulse: 64   There is no weight on file to calculate BMI. Generalized: In no acute distress Neck: Supple, no carotid bruits  Musculoskeletal: No deformity  Neurological examination  Mentation: Depending on his caregiver to provide history, cooperative, but difficult to follow three-step neurological examination  Cranial nerve II-XII: Pupils were equal round reactive to light. Extraocular movements were full. Visual field were full on confrontational test. Facial sensation and strength were normal. Hearing was intact to finger rubbing bilaterally. Uvula tongue midline. Head turning and shoulder shrug and were normal and symmetric.Tongue protrusion into cheek strength was normal. Motor: Normal tone, bulk and strength. Sensory: Withdraws to pain Coordination: Normal finger to nose, he has dysmetria at bilateral heel-to-shin. Gait: in wheelchair not ambulated Deep tendon reflexes: Brachioradialis 2/2, biceps 2/2, triceps 2/2, patellar 2/2, Achilles 1/1, plantar responses were flexor  bilaterally.   DIAGNOSTIC DATA (LABS, IMAGING, TESTING) -  ASSESSMENT AND PLAN 75 y.o. year old male has a past medical history of Mental retardation; gait abnormality, Seizures; Mood disorder; Anxiety; and Dementia. here to follow-up for his seizure disorder. Last seizure in 2011.  Continue Keppra 250 twice daily At risk for falls needs help to ambulate Follow-up yearly and when necessary Nilda RiggsNancy Shawn Wilcox Jaimes Eckert, [facility] CenterGNP, [facility]BC, APRN  [facility] [address], [address] [place], [place] [address] [phone]

## 2016-04-22 NOTE — Patient Instructions (Signed)
Per [facility]sg home sheet 

## 2016-04-26 NOTE — Progress Notes (Signed)
I have reviewed and agreed above plan. 

## 2016-07-01 IMAGING — CR DG HIP W/ PELVIS BILAT
6 series · 6 of 6 positions shown · non-contrast
Comparison: AP pelvis 04/10/2014.

CLINICAL DATA: Acute mental status changes. Syncope. Fall today.
Initial encounter.

EXAM:
BILATERAL HIP WITH PELVIS - 4+ VIEW

[t pelvis ap (1 of 2)]
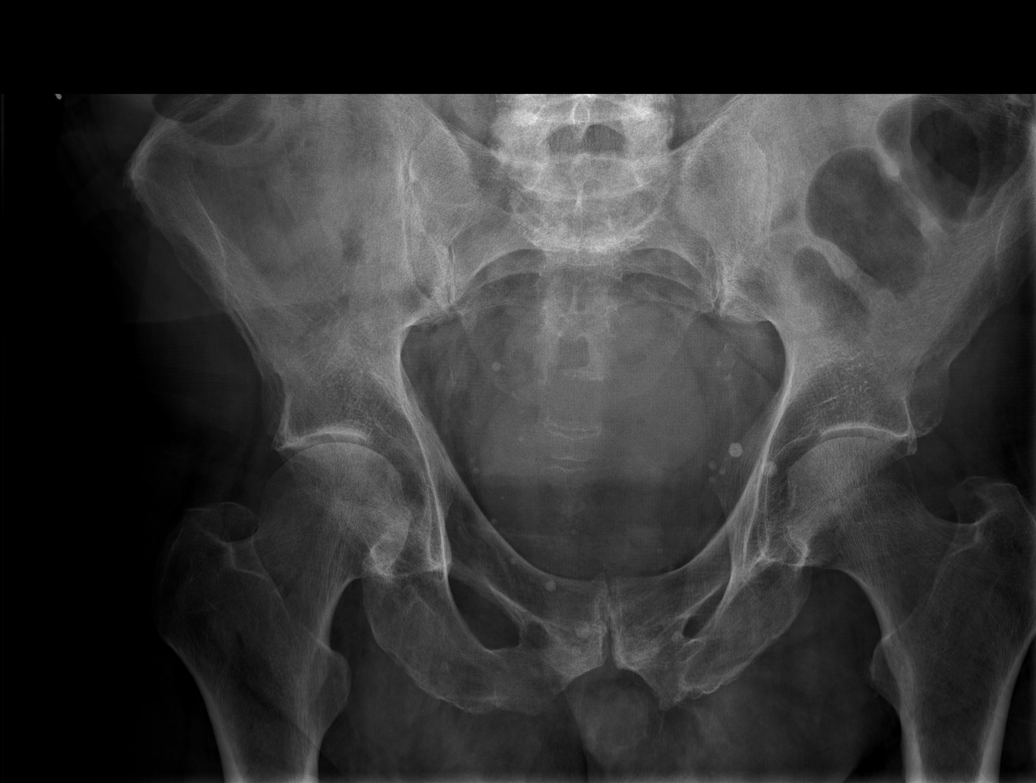

[t pelvis ap (2 of 2)]
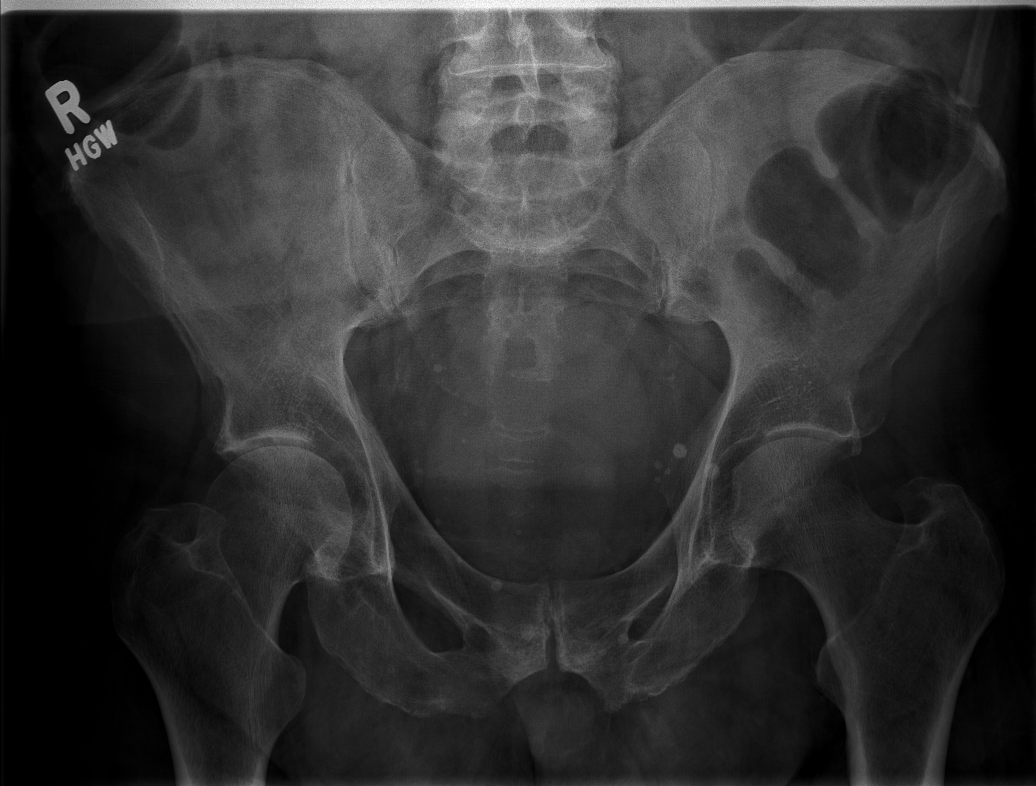

[t hip ap left]
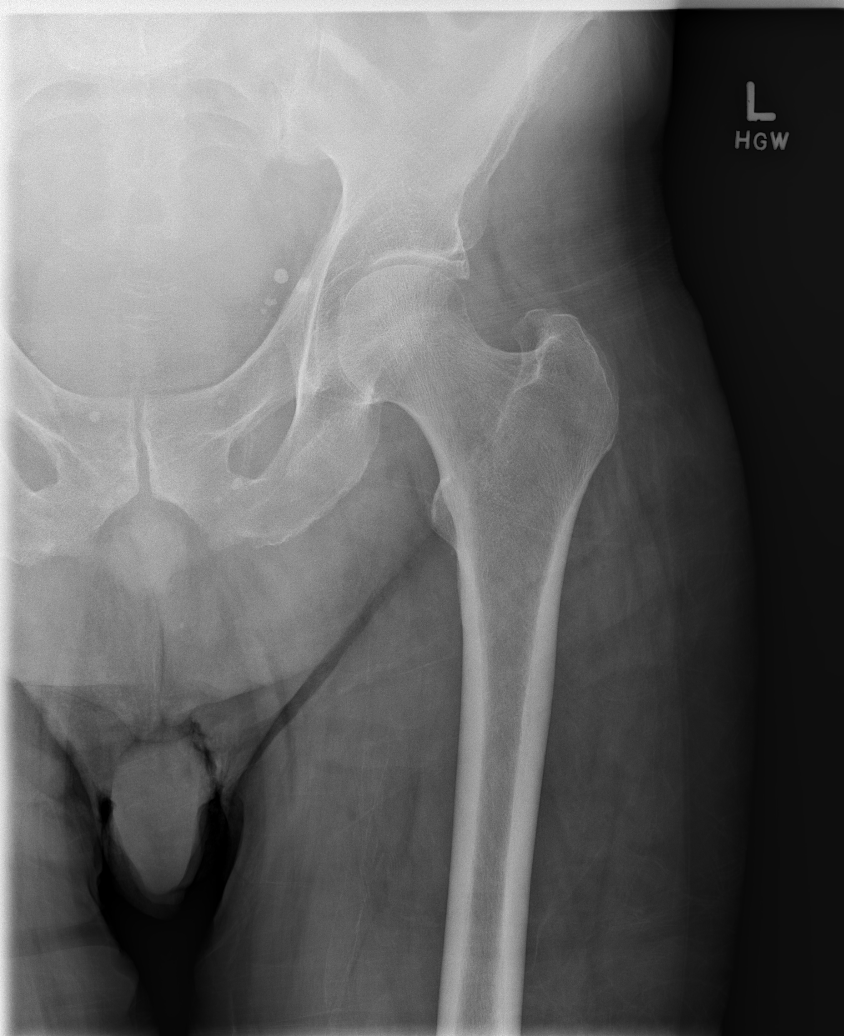

[t hip ap right]
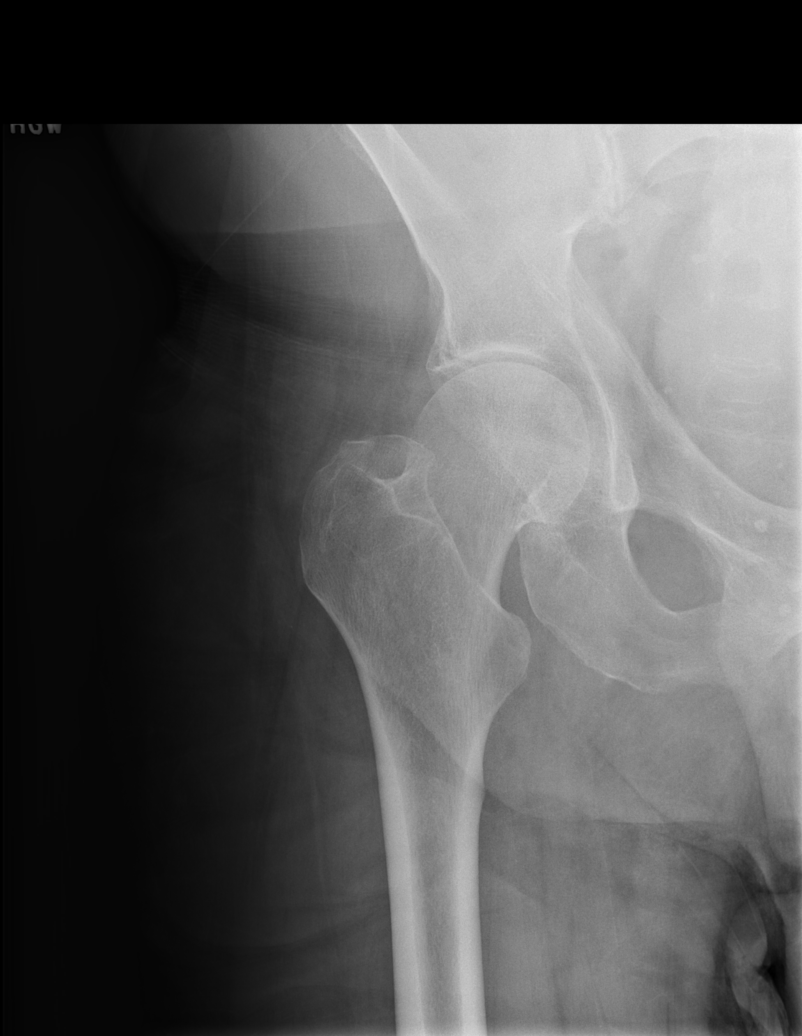

[t hip frog leg right]
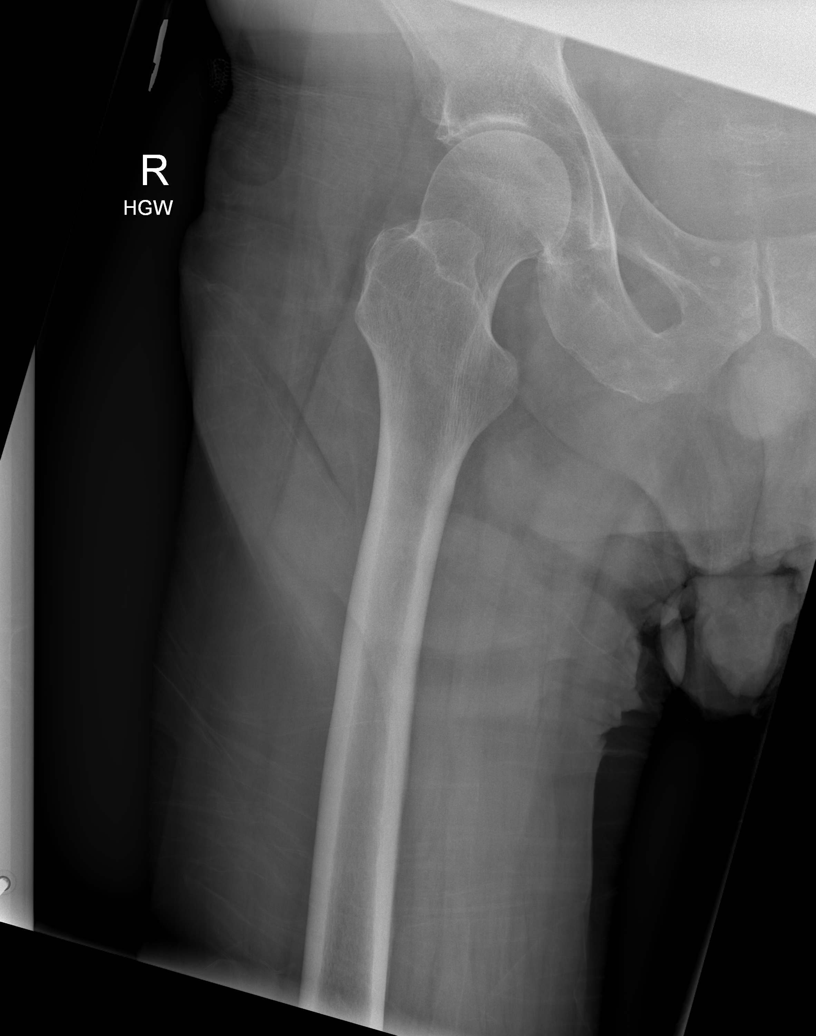

[t hip frog leg left]
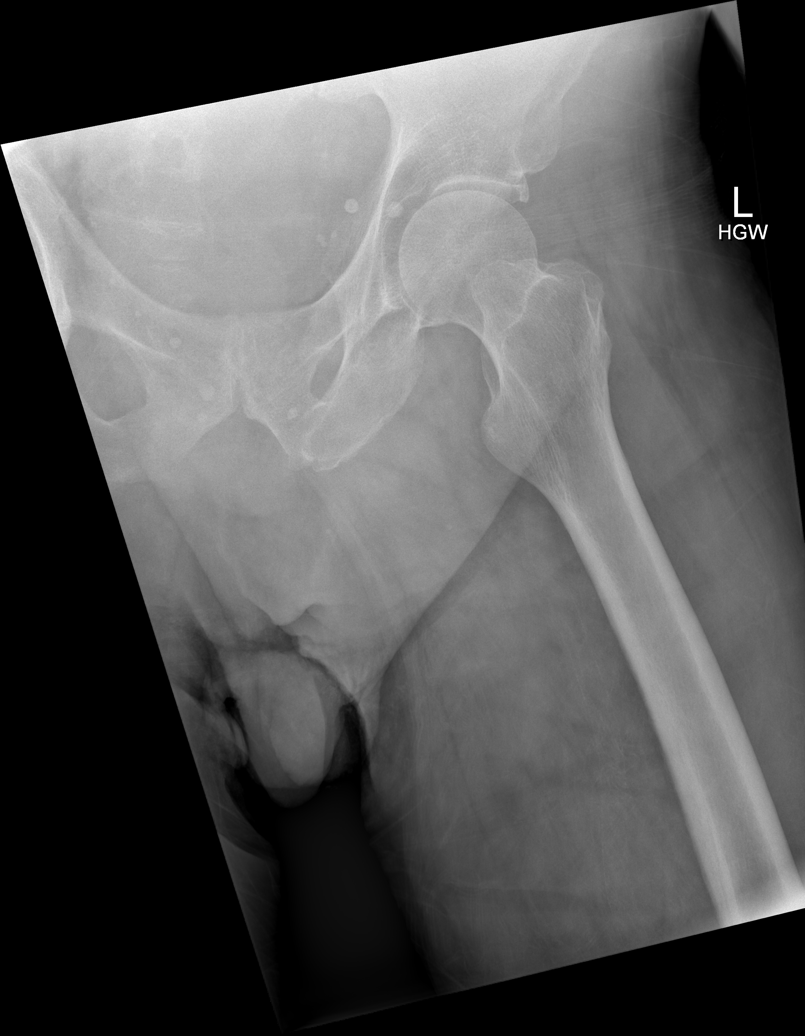

[6 of 6 positions shown; findings below may reference images not displayed]

FINDINGS: No evidence of acute fracture or dislocation involving either hip.
No fractures involving the pelvis. Sacroiliac joints and symphysis
pubis intact with degenerative changes. Joint spaces in both hips
well preserved for age. Visualized lower lumbar spine intact.
IMPRESSION: No acute osseous abnormality.

## 2016-11-22 ENCOUNTER — Emergency Department (HOSPITAL_COMMUNITY): Payer: Medicare Other

## 2016-11-22 ENCOUNTER — Emergency Department (HOSPITAL_COMMUNITY)
Admission: EM | Admit: 2016-11-22 | Discharge: 2016-11-22 | Disposition: A | Discharge status: Home / Self Care | Payer: Medicare Other | Attending: Emergency Medicine | Admitting: Emergency Medicine

## 2016-11-22 ENCOUNTER — Encounter (HOSPITAL_COMMUNITY): Payer: Self-pay

## 2016-11-22 DIAGNOSIS — Y92129 Unspecified place in nursing home as the place of occurrence of the external cause: Secondary | ICD-10-CM | POA: Diagnosis not present

## 2016-11-22 DIAGNOSIS — Y999 Unspecified external cause status: Secondary | ICD-10-CM | POA: Insufficient documentation

## 2016-11-22 DIAGNOSIS — N390 Urinary tract infection, site not specified: Secondary | ICD-10-CM | POA: Diagnosis not present

## 2016-11-22 DIAGNOSIS — M25551 Pain in right hip: Secondary | ICD-10-CM | POA: Insufficient documentation

## 2016-11-22 DIAGNOSIS — Y939 Activity, unspecified: Secondary | ICD-10-CM | POA: Diagnosis not present

## 2016-11-22 DIAGNOSIS — R930 Abnormal findings on diagnostic imaging of skull and head, not elsewhere classified: Secondary | ICD-10-CM | POA: Insufficient documentation

## 2016-11-22 DIAGNOSIS — W19XXXA Unspecified fall, initial encounter: Secondary | ICD-10-CM | POA: Insufficient documentation

## 2016-11-22 DIAGNOSIS — Z79899 Other long term (current) drug therapy: Secondary | ICD-10-CM | POA: Diagnosis not present

## 2016-11-22 LAB — CBC WITH DIFFERENTIAL/PLATELET
Basophils Absolute: 0 10*3/uL (ref 0.0–0.1)
Basophils Relative: 0 %
EOS PCT: 1 %
Eosinophils Absolute: 0.1 10*3/uL (ref 0.0–0.7)
HEMATOCRIT: 39.5 % (ref 39.0–52.0)
Hemoglobin: 12 g/dL — ABNORMAL LOW (ref 13.0–17.0)
LYMPHS ABS: 0.9 10*3/uL (ref 0.7–4.0)
LYMPHS PCT: 7 %
MCH: 30.6 pg (ref 26.0–34.0)
MCHC: 30.4 g/dL (ref 30.0–36.0)
MCV: 100.8 fL — AB (ref 78.0–100.0)
MONO ABS: 0.8 10*3/uL (ref 0.1–1.0)
MONOS PCT: 7 %
NEUTROS ABS: 10.7 10*3/uL — AB (ref 1.7–7.7)
Neutrophils Relative %: 85 %
PLATELETS: 212 10*3/uL (ref 150–400)
RBC: 3.92 MIL/uL — ABNORMAL LOW (ref 4.22–5.81)
RDW: 13.3 % (ref 11.5–15.5)
WBC: 12.5 10*3/uL — ABNORMAL HIGH (ref 4.0–10.5)

## 2016-11-22 LAB — URINALYSIS, MICROSCOPIC (REFLEX): RBC / HPF: NONE SEEN RBC/hpf (ref 0–5)

## 2016-11-22 LAB — I-STAT CHEM 8, ED
BUN: 21 mg/dL — ABNORMAL HIGH (ref 6–20)
CALCIUM ION: 1.33 mmol/L (ref 1.15–1.40)
CHLORIDE: 112 mmol/L — AB (ref 101–111)
Creatinine, Ser: 1.3 mg/dL — ABNORMAL HIGH (ref 0.61–1.24)
GLUCOSE: 106 mg/dL — AB (ref 65–99)
HCT: 38 % — ABNORMAL LOW (ref 39.0–52.0)
HEMOGLOBIN: 12.9 g/dL — AB (ref 13.0–17.0)
Potassium: 4.3 mmol/L (ref 3.5–5.1)
Sodium: 148 mmol/L — ABNORMAL HIGH (ref 135–145)
TCO2: 26 mmol/L (ref 0–100)

## 2016-11-22 LAB — COMPREHENSIVE METABOLIC PANEL
ALT: 14 U/L — AB (ref 17–63)
AST: 19 U/L (ref 15–41)
Albumin: 3.2 g/dL — ABNORMAL LOW (ref 3.5–5.0)
Alkaline Phosphatase: 96 U/L (ref 38–126)
Anion gap: 10 (ref 5–15)
BUN: 18 mg/dL (ref 6–20)
CHLORIDE: 112 mmol/L — AB (ref 101–111)
CO2: 25 mmol/L (ref 22–32)
CREATININE: 1.44 mg/dL — AB (ref 0.61–1.24)
Calcium: 10.2 mg/dL (ref 8.9–10.3)
GFR, EST AFRICAN AMERICAN: 53 mL/min — AB (ref >60)
GFR, EST NON AFRICAN AMERICAN: 46 mL/min — AB (ref >60)
Glucose, Bld: 109 mg/dL — ABNORMAL HIGH (ref 65–99)
POTASSIUM: 4.4 mmol/L (ref 3.5–5.1)
Sodium: 147 mmol/L — ABNORMAL HIGH (ref 135–145)
TOTAL PROTEIN: 6.5 g/dL (ref 6.5–8.1)
Total Bilirubin: 0.7 mg/dL (ref 0.3–1.2)

## 2016-11-22 LAB — URINALYSIS, ROUTINE W REFLEX MICROSCOPIC
BILIRUBIN URINE: NEGATIVE
Glucose, UA: NEGATIVE mg/dL
KETONES UR: NEGATIVE mg/dL
NITRITE: POSITIVE — AB
PROTEIN: NEGATIVE mg/dL
Specific Gravity, Urine: <1.005 — ABNORMAL LOW (ref 1.005–1.030)
pH: 6 (ref 5.0–8.0)

## 2016-11-22 LAB — I-STAT CG4 LACTIC ACID, ED: LACTIC ACID, VENOUS: 1.86 mmol/L (ref 0.5–1.9)

## 2016-11-22 MED ORDER — SODIUM CHLORIDE 0.9 % IV BOLUS (SEPSIS)
1000.0000 mL | Freq: Once | INTRAVENOUS | Status: AC
  Administered 2016-11-22: 1000 mL via INTRAVENOUS

## 2016-11-22 MED ORDER — CEFTRIAXONE SODIUM 1 G IJ SOLR: 1.0000 g | Freq: Once | INTRAMUSCULAR | Status: DC

## 2016-11-22 MED ORDER — CEPHALEXIN 500 MG PO CAPS: 500.0000 mg | ORAL_CAPSULE | Freq: Four times a day (QID) | ORAL | 0 refills | Status: AC

## 2016-11-22 MED ORDER — DEXTROSE 5 % IV SOLN
1.0000 g | Freq: Once | INTRAVENOUS | Status: AC
  Administered 2016-11-22: 1 g via INTRAVENOUS
  Filled 2016-11-22: qty 10

## 2016-11-22 NOTE — ED Notes (Signed)
Patient oxygen cut off per verbal request from EDP. Pt. Oxygen saturation ~90%. Pt. Placed back on 2L oxygen via [place]

## 2016-11-22 NOTE — ED Notes (Signed)
Patient transported to CT 

## 2016-11-22 NOTE — Discharge Instructions (Signed)
Contact a health care provider if: °· You have back pain. °· You have a fever. °· You feel nauseous or vomit. °· Your symptoms do not get better after 3 days. °· Your symptoms go away and then return. ° °

## 2016-11-22 NOTE — ED Provider Notes (Signed)
[facility]-EMERGENCY DEPT Provider Note   CSN: [id number] Arrival date & time: 11/22/16  1191     History   Chief Complaint Chief Complaint  Patient presents with  . Fall   Is a level V caveat due to MR and dementia HPI Shawn Wilcox is a 76 y.o. male with a past medical history of MR and dementia sent in from [facility] via EMS for unwitnessed fall. History is gathered by nursing notes, EMS notes, and speaking with the nurse who took care of him this morning. According to her. The patient was sitting in his wheelchair. She had just checked on him because he was yelling. She thought it was because she fell off and put it back on for him. She went to give an enema in about 5 minutes later returned and found him on the floor yelling for help. He is on his right side and complaining of right hip pain. His right hand was caught under a  Space under a dresser and causes him to be scratched. The patient did not lose consciousness. No history of seizures. He is noted to be hypoxic when the ambulance arrived and is not normally oxygen dependent. Nursing staff states that he has had a recent URI, but is unsure if he was fully positive.  HPI  Past Medical History:  Diagnosis Date  . Anxiety   . Dementia   . GERD (gastroesophageal reflux disease)   . Mental retardation   . Mood disorder (HCC)   . Seizures [facility])     Patient Active Problem List   Diagnosis Date Noted  . Acute encephalopathy 07/07/2014  . Altered mental status 07/06/2014  . Acute renal failure (HCC) 07/06/2014  . Generalized weakness 07/06/2014  . Near syncope 07/06/2014  . Dementia 07/06/2014  . Mental retardation   . Mood disorder (HCC)   . Abnormality of gait 04/17/2014  . Primary generalized seizure disorder (HCC) 04/17/2013  . Other convulsions 03/20/2012    Past Surgical History:  Procedure Laterality Date  . None         Home Medications    Prior to Admission medications   Medication Sig Start  Date End Date Taking? Authorizing Provider  acetaminophen (TYLENOL) 650 MG CR tablet Take 650 mg by mouth every 8 (eight) hours.    Historical Provider, MD  benztropine (COGENTIN) 0.5 MG tablet Take 0.5 mg by mouth 2 (two) times daily.    Historical Provider, MD  cholecalciferol (VITAMIN D) 1000 UNITS tablet Take 4,000 Units by mouth daily.     Historical Provider, MD  famotidine (PEPCID) 20 MG tablet Take 20 mg by mouth daily.    Historical Provider, MD  levETIRAcetam (KEPPRA) 250 MG tablet Take 250 mg by mouth 2 (two) times daily.    Historical Provider, MD  lithium carbonate 300 MG capsule Take 300 mg by mouth 2 (two) times daily with a meal.    Historical Provider, MD  LORazepam (ATIVAN) 0.5 MG tablet Take 1 tablet (0.5 mg total) by mouth every morning. 07/08/14   Marinda Elk, MD  memantine (NAMENDA) 10 MG tablet Take 10 mg by mouth 2 (two) times daily.    Historical Provider, MD  oxybutynin (DITROPAN-XL) 10 MG 24 hr tablet Take 10 mg by mouth daily.    Historical Provider, MD  risperiDONE (RISPERDAL) 0.5 MG tablet Take 0.5 mg by mouth every morning.     Historical Provider, MD  risperiDONE (RISPERDAL) 2 MG tablet Take 2 mg by mouth  at bedtime.    Historical Provider, MD    Family History Family History  Problem Relation Age of Onset  . Family history unknown: Yes    Social History Social History  Substance Use Topics  . Smoking status: Never Smoker  . Smokeless tobacco: Never Used  . Alcohol use No     Allergies   Patient has no known allergies.   Review of Systems Review of Systems   Unable to  Review systems  Due to MR/Dementia Physical Exam Updated Vital Signs BP 105/85   Pulse 86   Temp 98.7 F (37.1 C) (Oral)   Resp 16   Wt 92.1 kg   SpO2 93%   BMI 26.78 kg/m   Physical Exam  Constitutional: He appears well-developed and well-nourished. No distress.  HENT:  Head: Normocephalic and atraumatic.  Dry mucosa.  On cervical spine precautions  Eyes:  Conjunctivae are normal. No scleral icterus.  Neck: Normal range of motion. Neck supple.  Cardiovascular: Normal rate, regular rhythm and normal heart sounds.   Pulmonary/Chest: Effort normal and breath sounds normal. No respiratory distress.  Abdominal: Soft. He exhibits no distension. There is no tenderness.  Musculoskeletal: He exhibits no edema.  Full PROM of hips without pain No TTP of the spine. No TTP of the  Upper extremties   Neurological: He is alert.  Skin: Skin is warm and dry. He is not diaphoretic.  Psychiatric: His behavior is normal.  Nursing note and vitals reviewed.    ED Treatments / Results  Labs (all labs ordered are listed, but only abnormal results are displayed) Labs Reviewed  CBC WITH DIFFERENTIAL/PLATELET  COMPREHENSIVE METABOLIC PANEL  I-STAT CG4 LACTIC ACID, ED  I-STAT CHEM 8, ED    EKG  EKG Interpretation None       Radiology No results found.  Procedures Procedures (including critical care time)  Medications Ordered in ED Medications  sodium chloride 0.9 % bolus 1,000 mL (not administered)     Initial Impression / Assessment and Plan / ED Course  I have reviewed the triage vital signs and the nursing notes.  Pertinent labs & imaging results that were available during my care of the patient were reviewed by me and considered in my medical decision making (see chart for details).  Clinical Course as of Nov 23 1247  Mon Nov 22, 2016  0907 DG Hip Unilat  With Pelvis 2-3 Views Right [AH]  [phone] Negative  DG Chest 2 View [AH]  [phone] negative DG Lumbar Spine Complete [AH]    Clinical Course User Index [AH] Arthor CaptainAbigail Naiyana Barbian, PA-C     Patient imaging negative. Patient UA Postivie- sent for culture, treating with rocephin and keflex  He does not appear to have any focal injuries or pain.  He appears safe for discharge.   Final Clinical Impressions(s) / ED Diagnoses   Final diagnoses:  Fall, initial encounter  Urinary tract  infection without hematuria, site unspecified    New Prescriptions New Prescriptions   No medications on file     Arthor Captainbigail Kelli Egolf, PA-C 11/22/16 1259    Rolland PorterMark Kasey, MD 12/04/16 2258

## 2016-11-22 NOTE — ED Triage Notes (Signed)
Pt. Coming from [facility] for unwitnessed fall today. Pt. Hx of dementia and MR. Pt. Placed in c-collar as precaution. On arrival EMS noted pt. Lips blue and oxygen saturation at 90%. Pt. Placed on 2L via nasal cannula. EMS notes shortening and ext. Rotation to right hip. Skin tear also noted to right wrist. Nursing facility sts patient at baseline mentally.

## 2016-11-22 NOTE — ED Notes (Signed)
Patient transported to X-ray 

## 2016-11-22 NOTE — ED Notes (Signed)
EDP at bedside  

## 2016-11-24 LAB — URINE CULTURE

## 2016-11-25 ENCOUNTER — Telehealth (HOSPITAL_BASED_OUTPATIENT_CLINIC_OR_DEPARTMENT_OTHER): Payer: Self-pay

## 2016-11-25 NOTE — Progress Notes (Signed)
ED Antimicrobial Stewardship Positive Culture Follow Up  Shawn Wilcox is an 76 y.o. male who [facility] with a chief complaint of  Chief Complaint  Patient presents with  . Fall   ? Recent Results (from the past 720 hour(s))  Urine culture     Status: Abnormal   Collection Time: 11/22/16 10:16 AM  Result Value Ref Range Status   Specimen Description URINE, CATHETERIZED  Final   Special Requests NONE  Final   Culture >=100,000 COLONIES/mL STAPHYLOCOCCUS EPIDERMIDIS (A)  Final   Report Status 11/24/2016 FINAL  Final   Organism ID, Bacteria STAPHYLOCOCCUS EPIDERMIDIS (A)  Final      Susceptibility   Staphylococcus epidermidis - MIC*    CIPROFLOXACIN >=8 RESISTANT Resistant     GENTAMICIN <=0.5 SENSITIVE Sensitive     NITROFURANTOIN <=16 SENSITIVE Sensitive     OXACILLIN >=4 RESISTANT Resistant     TETRACYCLINE 2 SENSITIVE Sensitive     VANCOMYCIN 1 SENSITIVE Sensitive     TRIMETH/SULFA <=10 SENSITIVE Sensitive     CLINDAMYCIN <=0.25 SENSITIVE Sensitive     RIFAMPIN <=0.5 SENSITIVE Sensitive     Inducible Clindamycin NEGATIVE Sensitive     * >=100,000 COLONIES/mL STAPHYLOCOCCUS EPIDERMIDIS   ? Treated with Cephelaxin, organism resistant to prescribed antimicrobial ? New antibiotic prescription: 1 DS bactrim PO BID for 7 days ? ED Provider: Zenovia JarredEspina PA-C ? Shawn Wilcox 11/25/2016, 9:30 AM Infectious Diseases Pharmacist Phone# [phone]

## 2016-11-25 NOTE — Telephone Encounter (Signed)
Post ED Visit - Positive Culture Follow-up: Successful Patient Follow-Up  Culture assessed and recommendations reviewed by: []  Enzo BiNathan Batchelder, Pharm.D. []  Celedonio MiyamotoJeremy Frens, Pharm.D., BCPS []  Garvin FilaMike Maccia, Pharm.D. []  Georgina PillionElizabeth Martin, Pharm.D., BCPS []  SkokieMinh Pham, [address] BoulevardPharm.D., BCPS, AAHIVP []  Estella HuskMichelle Turner, Pharm.D., BCPS, AAHIVP []  Tennis Mustassie Stewart, Pharm.D. []  Sherle Poeob Vincent, [address]Pharm[address]  Positive urine culture, >/= 100,000 colonies -> Staph epidermis  []  Patient discharged without antimicrobial prescription and treatment is now indicated [x]  Organism is resistant to prescribed ED discharge antimicrobial(Cephalexin) []  Patient with positive blood cultures  Changes discussed with ED provider: Norvel RichardsFrancisco Espino PA New antibiotic prescription "1 DS Bactrim po BID 7 days" [facility], [facility] 11[facility] time 1250 [facility] [place] [phone] attn Eloise LevelsLynn   Marques Ericson, Olene Cravenatricia Dorn 11/25/2016, 1:02 PM

## 2017-01-19 ENCOUNTER — Other Ambulatory Visit: Payer: Self-pay | Admitting: Internal Medicine

## 2017-01-19 DIAGNOSIS — R296 Repeated falls: Secondary | ICD-10-CM

## 2017-01-26 ENCOUNTER — Ambulatory Visit
Admission: RE | Admit: 2017-01-26 | Discharge: 2017-01-26 | Disposition: A | Discharge status: Home / Self Care | Payer: Medicare Other | Source: Ambulatory Visit | Attending: Internal Medicine | Admitting: Internal Medicine

## 2017-01-26 DIAGNOSIS — R296 Repeated falls: Secondary | ICD-10-CM

## 2017-02-15 DEATH — deceased

## 2017-04-25 ENCOUNTER — Ambulatory Visit: Payer: Medicare Other | Admitting: Nurse Practitioner
